# Patient Record
Sex: Male | Born: 1965
Health system: Southern US, Community
[De-identification: ages and names within clinical notes are randomized; demographics above are authoritative.]

## PROBLEM LIST (undated history)

## (undated) DIAGNOSIS — G4733 Obstructive sleep apnea (adult) (pediatric): Principal | ICD-10-CM

## (undated) DIAGNOSIS — M545 Low back pain, unspecified: Secondary | ICD-10-CM

## (undated) DIAGNOSIS — N486 Induration penis plastica: Secondary | ICD-10-CM

## (undated) DIAGNOSIS — K219 Gastro-esophageal reflux disease without esophagitis: Secondary | ICD-10-CM

## (undated) DIAGNOSIS — M542 Cervicalgia: Secondary | ICD-10-CM

## (undated) DIAGNOSIS — I2510 Atherosclerotic heart disease of native coronary artery without angina pectoris: Secondary | ICD-10-CM

## (undated) DIAGNOSIS — J45909 Unspecified asthma, uncomplicated: Secondary | ICD-10-CM

## (undated) DIAGNOSIS — F419 Anxiety disorder, unspecified: Secondary | ICD-10-CM

## (undated) DIAGNOSIS — L740 Miliaria rubra: Secondary | ICD-10-CM

## (undated) DIAGNOSIS — G8929 Other chronic pain: Secondary | ICD-10-CM

## (undated) DIAGNOSIS — E785 Hyperlipidemia, unspecified: Secondary | ICD-10-CM

## (undated) DIAGNOSIS — J449 Chronic obstructive pulmonary disease, unspecified: Principal | ICD-10-CM

## (undated) HISTORY — DX: Chronic obstructive pulmonary disease, unspecified: J44.9

## (undated) HISTORY — DX: Low back pain, unspecified: M54.50

## (undated) HISTORY — DX: Cervicalgia: M54.2

## (undated) HISTORY — DX: Unspecified asthma, uncomplicated: J45.909

## (undated) HISTORY — DX: Anxiety disorder, unspecified: F41.9

## (undated) HISTORY — PX: COLONOSCOPY: SHX174

## (undated) HISTORY — DX: Hyperlipidemia, unspecified: E78.5

## (undated) HISTORY — DX: Gastro-esophageal reflux disease without esophagitis: K21.9

## (undated) HISTORY — DX: Atherosclerotic heart disease of native coronary artery without angina pectoris: I25.10

## (undated) HISTORY — PX: ESOPHAGOGASTRODUODENOSCOPY: SHX1529

## (undated) HISTORY — DX: Obstructive sleep apnea (adult) (pediatric): G47.33

## (undated) HISTORY — DX: Other chronic pain: G89.29

## (undated) HISTORY — DX: Low back pain: M54.5

---

## 2001-08-31 ENCOUNTER — Ambulatory Visit (HOSPITAL_BASED_OUTPATIENT_CLINIC_OR_DEPARTMENT_OTHER): Admission: RE | Admit: 2001-08-31 | Discharge: 2001-08-31 | Payer: Self-pay | Admitting: Internal Medicine

## 2002-09-02 ENCOUNTER — Ambulatory Visit (HOSPITAL_COMMUNITY): Admission: RE | Admit: 2002-09-02 | Discharge: 2002-09-02 | Payer: Self-pay | Admitting: Gastroenterology

## 2007-02-02 ENCOUNTER — Encounter: Admission: RE | Admit: 2007-02-02 | Discharge: 2007-02-02 | Payer: Self-pay | Admitting: Internal Medicine

## 2007-08-04 ENCOUNTER — Encounter: Admission: RE | Admit: 2007-08-04 | Discharge: 2007-08-04 | Payer: Self-pay | Admitting: Internal Medicine

## 2008-03-12 ENCOUNTER — Ambulatory Visit: Payer: Self-pay | Admitting: Internal Medicine

## 2015-12-21 ENCOUNTER — Institutional Professional Consult (permissible substitution): Payer: BLUE CROSS/BLUE SHIELD | Admitting: Neurology

## 2016-01-09 ENCOUNTER — Encounter: Payer: Self-pay | Admitting: Neurology

## 2016-01-09 ENCOUNTER — Ambulatory Visit (INDEPENDENT_AMBULATORY_CARE_PROVIDER_SITE_OTHER): Payer: BLUE CROSS/BLUE SHIELD | Admitting: Neurology

## 2016-01-09 VITALS — BP 130/82 | HR 82 | Resp 20 | Ht 65.0 in | Wt 175.0 lb

## 2016-01-09 DIAGNOSIS — J449 Chronic obstructive pulmonary disease, unspecified: Secondary | ICD-10-CM | POA: Diagnosis not present

## 2016-01-09 DIAGNOSIS — R0689 Other abnormalities of breathing: Secondary | ICD-10-CM

## 2016-01-09 DIAGNOSIS — Z91199 Patient's noncompliance with other medical treatment and regimen due to unspecified reason: Secondary | ICD-10-CM | POA: Insufficient documentation

## 2016-01-09 DIAGNOSIS — F1190 Opioid use, unspecified, uncomplicated: Secondary | ICD-10-CM

## 2016-01-09 DIAGNOSIS — Z9119 Patient's noncompliance with other medical treatment and regimen: Secondary | ICD-10-CM | POA: Diagnosis not present

## 2016-01-09 DIAGNOSIS — R0683 Snoring: Secondary | ICD-10-CM

## 2016-01-09 DIAGNOSIS — G4733 Obstructive sleep apnea (adult) (pediatric): Secondary | ICD-10-CM

## 2016-01-09 DIAGNOSIS — Z720 Tobacco use: Secondary | ICD-10-CM | POA: Insufficient documentation

## 2016-01-09 DIAGNOSIS — F17200 Nicotine dependence, unspecified, uncomplicated: Secondary | ICD-10-CM | POA: Diagnosis not present

## 2016-01-09 HISTORY — DX: Obstructive sleep apnea (adult) (pediatric): G47.33

## 2016-01-09 NOTE — Progress Notes (Signed)
SLEEP MEDICINE CLINIC   Provider:  Melvyn Park  Garrett Park, M D  Referring Provider: Jarome MatinPaterson, Daniel, MD Primary Care Physician:  Garrett FillersPATERSON,Garrett G, MD  Chief Complaint  Patient presents with  . New Patient (Initial Visit)    has been on cpap before but hasn't used it in years, has used an oral device which helps, rm 10    HPI:  Garrett Park is a 50 y.o. male , seen here as a referral   from Dr. Eloise HarmanPaterson for a sleep evaluation,  Chief complaint according to patient : " I snore and don't want back on CPAP "   Mr. Garrett Park reports that he was diagnosed with obstructive sleep apnea and placed on CPAP. However the result was not satisfying. He reports that his job would drop and air would actually not find its way into the right " pipe" at times he felt bloated as if he swallowed air. He changed years ago from his CPAP to a dental device which seemed to have helped much better. This dental device was an Internet order and not made to measure. His primary care physician or send him to make sure that we are treating sleep apnea versus snoring and also to document to what degree apnea may still be present. Since the patient has had a positive experience with a dental device he would like to be referred to a specialty dentist. In the meantime the patient had undergone carpal tunnel surgical repair and was his surgeon that made him aware that he still had apnea, with him being under general anesthesia when the apneas were witnessed. His wife reports him to be a thunderous snorer. The patient still suffers from acid reflux, he had elbow and wrist surgery for carpal tunnel. He has chronic lower back pain. He is known to be a thunderous snorer with witnessed sleep apnea by using a dental device. He endorsed the Epworth sleepiness score at 8 points, fatigue severity score at 40 points.  He is a current smoker, 1 ppd, no ETOH and No caffeine.   Sleep habits are as follows: The patient's usual bedtime is between  10 and 11 PM at night. His bedroom is described as core, quiet and dark and he shares a bedroom with his wife. Normally he sleeps on only one pillow. He has 2-3 bathroom breaks each night, but denies any fevers, chills palpitations, anorexia, diaphoresis at night. He'll been diagnosed with obstructive sleep apnea but was unable to use CPAP due to the feeling of gulping air. His sleep apnea was diagnosed in November 2002 and at that time was rated as severe,[place] sleep [place] took place in [place] , [place].   Sleep medical history and family sleep history:  No neck surgery or trauma. No parental history of OSA. No history of sleepwalking, night terrors or parasomnia is otherwise. He reports nonrestorative sleep while using a dental device.   Review of Systems: Out of a complete 14 system review, the patient complains of only the following symptoms, and all other reviewed systems are negative. See above      Social History   Social History  . Marital Status: Married    Spouse Name: N/A  . Number of Children: N/A  . Years of Education: N/A   Occupational History  . Not on file.   Social History Main Topics  . Smoking status: Current Every Day Smoker -- 1.00 packs/day for 35 years    Types: Cigarettes  . Smokeless tobacco: Not  on file  . Alcohol Use: No  . Drug Use: No  . Sexual Activity: Not on file   Other Topics Concern  . Not on file   Social History Narrative   Drinks about 3-4 cups of soda daily.    Family History  Problem Relation Age of Onset  . Diabetes Mellitus II Father     Past Medical History  Diagnosis Date  . Anxiety   . Neck pain   . Chronic low back pain   . Asthma   . OSA (obstructive sleep apnea)     intolerant of cpap  . GERD (gastroesophageal reflux disease)   . Dyslipidemia     Past Surgical History  Procedure Laterality Date  . Esophagogastroduodenoscopy    . Colonoscopy      Current Outpatient Prescriptions  Medication Sig  Dispense Refill  . HYDROcodone-acetaminophen (NORCO/VICODIN) 5-325 MG tablet Take 1 tablet by mouth 3 (three) times daily as needed for moderate pain.    Marland Kitchen omeprazole (PRILOSEC) 20 MG capsule Take 20 mg by mouth daily.     No current facility-administered medications for this visit.    Allergies as of 01/09/2016  . (No Known Allergies)    Vitals: BP 130/82 mmHg  Pulse 82  Resp 20  Ht  (1.651 m)  Wt 175 lb (79.379 kg)  BMI 29.12 kg/m2 Last Weight:  Wt Readings from Last 1 Encounters:  01/09/16 175 lb (79.379 kg)   NWG:NFAO mass index is 29.12 kg/(m^2).     Last Height:   Ht Readings from Last 1 Encounters:  01/09/16  (1.651 m)    Physical exam:  General: The patient is awake, alert and appears not in acute distress. The patient is well groomed. Head: Normocephalic, atraumatic. Neck is supple. Mallampati 3, red and swollen uvula.   neck circumference:16.25 . Nasal airflow restricted , TMJ click  Is  evident . Retrognathia is seen.  Cardiovascular:  Regular rate and rhythm , without  murmurs or carotid bruit, and without distended neck veins. Respiratory: Lungs are clear to auscultation. Skin:  Without evidence of edema, or rash Trunk: BMI is 29.  Neurologic exam : The patient is awake and alert, oriented to place and time.   Memory subjective  described as intact.  Attention span & concentration ability appears normal.  Speech is fluent,  without dysarthria, dysphonia or aphasia.  Mood and affect are appropriate.  Cranial nerves: Pupils are equal and briskly reactive to light. Funduscopic exam without evidence of pallor or edema. Extraocular movements  in vertical and horizontal planes intact and without nystagmus. Visual fields by finger perimetry are intact. Hearing to finger rub intact.   Facial sensation intact to fine touch.  Facial motor strength is symmetric and tongue and uvula move midline. Shoulder shrug was symmetrical.   Motor exam:   Normal tone,  muscle bulk and symmetric strength in all extremities.  Sensory:  Fine touch, pinprick and vibration were tested in all extremities. Status post carpal tunnel repair.  Coordination: Rapid alternating movements in the fingers/hands normal without evidence of ataxia, dysmetria or tremor.  Gait and station: Patient walks without assistive device and is able unassisted to climb up to the exam table. Strength within normal limits.  Stance is stable and normal.  Deep tendon reflexes: in the upper and lower extremities are symmetric and intact.   The patient was advised of the nature of the diagnosed sleep disorder , the treatment options and risks for general a  health and wellness arising from not treating the condition.  I spent more than 40 minutes of face to face time with the patient. Greater than 50% of time was spent in counseling and coordination of care. We have discussed the diagnosis and differential and I answered the patient's questions.     Assessment:  After physical and neurologic examination, review of laboratory studies,  Personal review of imaging studies, reports of other /same  Imaging studies ,  Results of polysomnography/ neurophysiology testing and pre-existing records as far as provided in visit., my assessment is   1) Active 1ppd smoker with thunderous snoring, witnessed apneas intolerant to CPAP. Has GERD and TMJ, too.   2) not obese. Morning headaches. TMJ pain.   3) patient needs to establish a degree of apnea and type of apnea. Hypoxemia ? Hypercapnia?   Plan:  Treatment plan and additional workup : Attended sleep study with evaluation of possible COPD , repeated bronchitis versus allergic rhinitis.  Evaluate for hypoxemia and if AHI under 10 , titrate oxygen.  Patient 's goal is a dental device. Referral to  sleep DMD if his own dentist ( Dr. Suzzanne Cloud , [place] ) is not able to provide a device.   RV after sleep study.  Porfirio Mylar Anjolaoluwa Siguenza MD  01/09/2016   CC: Jarome Matin, Md [address] [place], [place] [address]

## 2016-01-17 ENCOUNTER — Encounter: Payer: Self-pay | Admitting: *Deleted

## 2016-01-25 ENCOUNTER — Ambulatory Visit (INDEPENDENT_AMBULATORY_CARE_PROVIDER_SITE_OTHER): Payer: BLUE CROSS/BLUE SHIELD | Admitting: Neurology

## 2016-01-25 DIAGNOSIS — F17200 Nicotine dependence, unspecified, uncomplicated: Secondary | ICD-10-CM

## 2016-01-25 DIAGNOSIS — Z91199 Patient's noncompliance with other medical treatment and regimen due to unspecified reason: Secondary | ICD-10-CM

## 2016-01-25 DIAGNOSIS — F1190 Opioid use, unspecified, uncomplicated: Secondary | ICD-10-CM

## 2016-01-25 DIAGNOSIS — R0683 Snoring: Secondary | ICD-10-CM

## 2016-01-25 DIAGNOSIS — Z9119 Patient's noncompliance with other medical treatment and regimen: Secondary | ICD-10-CM

## 2016-01-25 DIAGNOSIS — G4733 Obstructive sleep apnea (adult) (pediatric): Secondary | ICD-10-CM

## 2016-01-26 NOTE — Sleep Study (Signed)
Please see the scanned sleep study interpretation located in the procedure tab within the chart review section.   

## 2016-01-31 ENCOUNTER — Telehealth: Payer: Self-pay

## 2016-01-31 NOTE — Telephone Encounter (Signed)
I called pt to discuss sleep study results. No answer, left a message asking him to call me back.    

## 2016-02-02 NOTE — Telephone Encounter (Signed)
Spoke to pt regarding his sleep study results. I advised him that his study showed sleep apnea and treatment is advised. Since there was no prolonged period of hypoxemia, alternative therapies may include an oral appliance or ENT procedure, but Dr. Vickey Hugerohmeier prefers CPAP for pt. I advised weight loss and sleeping on his side. Pt wishes to make an appt to discuss further. Pt is concerned about starting cpap, would prefer the dental device, but wants to discuss which would be best. Appt made for 5/10 at 1:30. Pt verbalized understanding.

## 2016-03-07 ENCOUNTER — Ambulatory Visit (INDEPENDENT_AMBULATORY_CARE_PROVIDER_SITE_OTHER): Payer: BLUE CROSS/BLUE SHIELD | Admitting: Neurology

## 2016-03-07 ENCOUNTER — Encounter: Payer: Self-pay | Admitting: Neurology

## 2016-03-07 VITALS — BP 108/70 | HR 82 | Resp 20 | Ht 71.0 in | Wt 173.0 lb

## 2016-03-07 DIAGNOSIS — M2619 Other specified anomalies of jaw-cranial base relationship: Secondary | ICD-10-CM | POA: Diagnosis not present

## 2016-03-07 DIAGNOSIS — G4733 Obstructive sleep apnea (adult) (pediatric): Secondary | ICD-10-CM

## 2016-03-07 DIAGNOSIS — Z789 Other specified health status: Secondary | ICD-10-CM | POA: Diagnosis not present

## 2016-03-07 NOTE — Progress Notes (Addendum)
SLEEP MEDICINE CLINIC   Provider:  Melvyn Novasarmen  Jaiven Graveline, M D  Referring Provider: Jarome MatinPaterson, Daniel, MD Primary Care Physician:  Garlan FillersPATERSON,DANIEL G, MD  Chief Complaint  Patient presents with  . Follow-up    discuss sleep study results    HPI:  Garrett Park is a 50 y.o. male , seen here as a referral   from Dr. Eloise HarmanPaterson for a sleep evaluation,  Chief complaint according to patient : " I snore and don't want back on CPAP "   Mr. Garrett Park reports that he was diagnosed with obstructive sleep apnea and placed on CPAP. However the result was not satisfying. He reports that his job would drop and air would actually not find its way into the right " pipe" at times he felt bloated as if he swallowed air. He changed years ago from his CPAP to a dental device which seemed to have helped much better. This dental device was an Internet order and not made to measure. His primary care physician or send him to make sure that we are treating sleep apnea versus snoring and also to document to what degree apnea may still be present. Since the patient has had a positive experience with a dental device he would like to be referred to a specialty dentist. In the meantime the patient had undergone carpal tunnel surgical repair and was his surgeon that made him aware that he still had apnea, with him being under general anesthesia when the apneas were witnessed. His wife reports him to be a thunderous snorer. The patient still suffers from acid reflux, he had elbow and wrist surgery for carpal tunnel. He has chronic lower back pain. He is known to be a thunderous snorer with witnessed sleep apnea by using a dental device. He endorsed the Epworth sleepiness score at 8 points, fatigue severity score at 40 points.  He is a current smoker, 1 ppd, no ETOH and No caffeine.   Sleep habits are as follows: The patient's usual bedtime is between 10 and 11 PM at night. His bedroom is described as core, quiet and dark and he shares a  bedroom with his wife. Normally he sleeps on only one pillow. He has 2-3 bathroom breaks each night, but denies any fevers, chills palpitations, anorexia, diaphoresis at night. He'll been diagnosed with obstructive sleep apnea but was unable to use CPAP due to the feeling of gulping air. His sleep apnea was diagnosed in November 2002 and at that time was rated as severe,The sleep studies took place in [place] , [place]. Sleep medical history and family sleep history:  No neck surgery or trauma. No parental history of OSA. No history of sleepwalking, night terrors or parasomnia is otherwise. He reports nonrestorative sleep while using a dental device.   Interval history from 03/07/2016, Mr. Garrett Park is seen here today in a revisit following his sleep study from 01/25/2016, Mr. Garrett Park was diagnosed with a moderate to severe apnea at an AHI of 34.6 which was accentuated during supine sleep to 71.0. Given that the patient does not morbidly obese this is a quite significant degree of apnea. He does have mild retrognathia though. He did not have prolonged oxygen desaturations actually the total time spent at less than then 90% oxygen saturation was 1.1 minutes. He did not have significant periodic limb movements, and no tachybradycardia arrhythmia.  Based on this finding the patient could indeed try a dental device. I would usually recommend for this degree of apnea CPAP  but he has made a conscious effort before and because of oral air leaks could not find restorative sleep. The patient is open to both suggestions. The patient prefers a dental device.   Review of Systems: Out of a complete 14 system review, the patient complains of only the following symptoms, and all other reviewed systems are negative. See above      Social History   Social History  . Marital Status: Married    Spouse Name: N/A  . Number of Children: N/A  . Years of Education: N/A   Occupational History  . Not on file.    Social History Main Topics  . Smoking status: Current Every Day Smoker -- 1.00 packs/day for 35 years    Types: Cigarettes  . Smokeless tobacco: Not on file  . Alcohol Use: No  . Drug Use: No  . Sexual Activity: Not on file   Other Topics Concern  . Not on file   Social History Narrative   Drinks about 3-4 cups of soda daily.    Family History  Problem Relation Age of Onset  . Diabetes Mellitus II Father     Past Medical History  Diagnosis Date  . Anxiety   . Neck pain   . Chronic low back pain   . Asthma   . OSA (obstructive sleep apnea)     intolerant of cpap  . GERD (gastroesophageal reflux disease)   . Dyslipidemia   . OSA and COPD overlap syndrome (HCC) 01/09/2016    Past Surgical History  Procedure Laterality Date  . Esophagogastroduodenoscopy    . Colonoscopy      Current Outpatient Prescriptions  Medication Sig Dispense Refill  . HYDROcodone-acetaminophen (NORCO/VICODIN) 5-325 MG tablet Take 1 tablet by mouth 3 (three) times daily as needed for moderate pain.    Marland Kitchen omeprazole (PRILOSEC) 20 MG capsule Take 20 mg by mouth daily.     No current facility-administered medications for this visit.    Allergies as of 03/07/2016  . (No Known Allergies)    Vitals: BP 108/70 mmHg  Pulse 82  Resp 20  Ht 5\' 11"  (1.803 m)  Wt 173 lb (78.472 kg)  BMI 24.14 kg/m2 Last Weight:  Wt Readings from Last 1 Encounters:  03/07/16 173 lb (78.472 kg)   WUJ:WJXB mass index is 24.14 kg/(m^2).     Last Height:   Ht Readings from Last 1 Encounters:  03/07/16 5\' 11"  (1.803 m)    Physical exam:  General: The patient is awake, alert and appears not in acute distress. The patient is well groomed. Head: Normocephalic, atraumatic. Neck is supple. Mallampati 3, red and swollen uvula.   neck circumference:16.25 . Nasal airflow restricted , TMJ click  Is  evident . Retrognathia is seen.  Cardiovascular:  Regular rate and rhythm , without  murmurs or carotid bruit, and  without distended neck veins. Respiratory: Lungs are clear to auscultation. Skin:  Without evidence of edema, or rash Trunk: BMI is 29.  Neurologic exam : The patient is awake and alert, oriented to place and time.    Cranial nerves: Pupils are equal and briskly reactive to light. Funduscopic exam without evidence of pallor or edema. Extraocular movements  in vertical and horizontal planes intact and without nystagmus. Visual fields by finger perimetry are intact. Hearing to finger rub intact.   Facial sensation intact to fine touch.  Facial motor strength is symmetric and tongue and uvula move midline. Shoulder shrug was symmetrical.   Stance is  stable and normal.    The patient was advised of the nature of the diagnosed sleep disorder , the treatment options and risks for general a health and wellness arising from not treating the condition.  I spent more than of face to face time with the patient. Greater than 50% of time was spent in counseling and coordination of care. We have discussed the diagnosis and differential and I answered the patient's questions.     Assessment:  After physical and neurologic examination, review of laboratory studies,  Personal review of imaging studies, reports of other /same  Imaging studies ,  Results of polysomnography/ neurophysiology testing and pre-existing records as far as provided in visit., my assessment is  1)  Hypersomnia, Epworth now 13 , with OSA-/  moderate to severe degree of obstructive sleep apnea without hypercapnia or hypoxemia and without tachybradycardia arrhythmia or PLMS. For this reason the patient is considered suitable for dental device treatment . In  addition a strong positional component and if he could get Mr. Bonet to not sleep on his back his apnea with is significantly reduced already. I suggest to use a tennis ball sewn to the back of her pajama top or T-shirt which would not show midnight and avoids the supine sleep  position.  2) Active 1ppd smoker with thunderous snoring, witnessed apneas intolerant to CPAP.   3) TMJ pain   Plan:  Treatment plan and additional workup :   Patient 's goal is a dental device. Referral to  sleep DMD if his own dentist ( Dr. Suzzanne Cloud , [place] ) is not able to provide a device.    Porfirio Mylar Dhruvan Gullion MD  03/07/2016   CC: Jarome Matin, Md [address] [place], [place] [address]

## 2016-03-07 NOTE — Patient Instructions (Signed)

## 2016-03-12 ENCOUNTER — Encounter: Payer: Self-pay | Admitting: *Deleted

## 2016-12-04 DIAGNOSIS — Z0000 Encounter for general adult medical examination without abnormal findings: Secondary | ICD-10-CM | POA: Diagnosis not present

## 2016-12-04 DIAGNOSIS — R8299 Other abnormal findings in urine: Secondary | ICD-10-CM | POA: Diagnosis not present

## 2016-12-04 DIAGNOSIS — Z125 Encounter for screening for malignant neoplasm of prostate: Secondary | ICD-10-CM | POA: Diagnosis not present

## 2016-12-11 DIAGNOSIS — Z0000 Encounter for general adult medical examination without abnormal findings: Secondary | ICD-10-CM | POA: Diagnosis not present

## 2016-12-11 DIAGNOSIS — M5136 Other intervertebral disc degeneration, lumbar region: Secondary | ICD-10-CM | POA: Diagnosis not present

## 2016-12-11 DIAGNOSIS — R05 Cough: Secondary | ICD-10-CM | POA: Diagnosis not present

## 2016-12-11 DIAGNOSIS — R5383 Other fatigue: Secondary | ICD-10-CM | POA: Diagnosis not present

## 2016-12-11 DIAGNOSIS — Z1389 Encounter for screening for other disorder: Secondary | ICD-10-CM | POA: Diagnosis not present

## 2016-12-11 DIAGNOSIS — M545 Low back pain: Secondary | ICD-10-CM | POA: Diagnosis not present

## 2017-01-29 ENCOUNTER — Inpatient Hospital Stay
Admission: EM | Admit: 2017-01-29 | Discharge: 2017-01-31 | DRG: 482 | Disposition: A | Discharge status: Home Health | Payer: BLUE CROSS/BLUE SHIELD | Attending: Specialist | Admitting: Specialist

## 2017-01-29 ENCOUNTER — Emergency Department: Payer: BLUE CROSS/BLUE SHIELD

## 2017-01-29 ENCOUNTER — Encounter: Payer: Self-pay | Admitting: Emergency Medicine

## 2017-01-29 DIAGNOSIS — Y92009 Unspecified place in unspecified non-institutional (private) residence as the place of occurrence of the external cause: Secondary | ICD-10-CM | POA: Diagnosis not present

## 2017-01-29 DIAGNOSIS — G4733 Obstructive sleep apnea (adult) (pediatric): Secondary | ICD-10-CM | POA: Diagnosis present

## 2017-01-29 DIAGNOSIS — S72044A Nondisplaced fracture of base of neck of right femur, initial encounter for closed fracture: Secondary | ICD-10-CM | POA: Diagnosis not present

## 2017-01-29 DIAGNOSIS — Z419 Encounter for procedure for purposes other than remedying health state, unspecified: Secondary | ICD-10-CM

## 2017-01-29 DIAGNOSIS — E785 Hyperlipidemia, unspecified: Secondary | ICD-10-CM | POA: Diagnosis not present

## 2017-01-29 DIAGNOSIS — Z833 Family history of diabetes mellitus: Secondary | ICD-10-CM | POA: Diagnosis not present

## 2017-01-29 DIAGNOSIS — K219 Gastro-esophageal reflux disease without esophagitis: Secondary | ICD-10-CM | POA: Diagnosis present

## 2017-01-29 DIAGNOSIS — S72011A Unspecified intracapsular fracture of right femur, initial encounter for closed fracture: Principal | ICD-10-CM | POA: Diagnosis present

## 2017-01-29 DIAGNOSIS — W1789XA Other fall from one level to another, initial encounter: Secondary | ICD-10-CM | POA: Diagnosis present

## 2017-01-29 DIAGNOSIS — Z79899 Other long term (current) drug therapy: Secondary | ICD-10-CM

## 2017-01-29 DIAGNOSIS — S72001A Fracture of unspecified part of neck of right femur, initial encounter for closed fracture: Secondary | ICD-10-CM | POA: Diagnosis not present

## 2017-01-29 DIAGNOSIS — F17210 Nicotine dependence, cigarettes, uncomplicated: Secondary | ICD-10-CM | POA: Diagnosis not present

## 2017-01-29 DIAGNOSIS — S72034A Nondisplaced midcervical fracture of right femur, initial encounter for closed fracture: Secondary | ICD-10-CM | POA: Diagnosis not present

## 2017-01-29 DIAGNOSIS — S299XXA Unspecified injury of thorax, initial encounter: Secondary | ICD-10-CM | POA: Diagnosis not present

## 2017-01-29 LAB — CBC WITH DIFFERENTIAL/PLATELET
BASOS ABS: 0.1 10*3/uL (ref 0–0.1)
BASOS PCT: 1 %
EOS PCT: 3 %
Eosinophils Absolute: 0.3 10*3/uL (ref 0–0.7)
HCT: 39.9 % — ABNORMAL LOW (ref 40.0–52.0)
Hemoglobin: 14 g/dL (ref 13.0–18.0)
LYMPHS PCT: 23 %
Lymphs Abs: 2.4 10*3/uL (ref 1.0–3.6)
MCH: 33.4 pg (ref 26.0–34.0)
MCHC: 35 g/dL (ref 32.0–36.0)
MCV: 95.3 fL (ref 80.0–100.0)
Monocytes Absolute: 0.7 10*3/uL (ref 0.2–1.0)
Monocytes Relative: 7 %
Neutro Abs: 7 10*3/uL — ABNORMAL HIGH (ref 1.4–6.5)
Neutrophils Relative %: 66 %
PLATELETS: 166 10*3/uL (ref 150–440)
RBC: 4.18 MIL/uL — AB (ref 4.40–5.90)
RDW: 12.9 % (ref 11.5–14.5)
WBC: 10.5 10*3/uL (ref 3.8–10.6)

## 2017-01-29 LAB — APTT: APTT: 29 s (ref 24–36)

## 2017-01-29 LAB — PROTIME-INR
INR: 0.91
Prothrombin Time: 12.2 seconds (ref 11.4–15.2)

## 2017-01-29 LAB — COMPREHENSIVE METABOLIC PANEL
ALBUMIN: 3.6 g/dL (ref 3.5–5.0)
ALK PHOS: 69 U/L (ref 38–126)
ALT: 47 U/L (ref 17–63)
ANION GAP: 7 (ref 5–15)
AST: 37 U/L (ref 15–41)
BUN: 12 mg/dL (ref 6–20)
CALCIUM: 8.3 mg/dL — AB (ref 8.9–10.3)
CO2: 26 mmol/L (ref 22–32)
Chloride: 98 mmol/L — ABNORMAL LOW (ref 101–111)
Creatinine, Ser: 1.25 mg/dL — ABNORMAL HIGH (ref 0.61–1.24)
GFR calc non Af Amer: >60 mL/min (ref >60)
Glucose, Bld: 100 mg/dL — ABNORMAL HIGH (ref 65–99)
POTASSIUM: 3.7 mmol/L (ref 3.5–5.1)
SODIUM: 131 mmol/L — AB (ref 135–145)
TOTAL PROTEIN: 7.4 g/dL (ref 6.5–8.1)
Total Bilirubin: 0.4 mg/dL (ref 0.3–1.2)

## 2017-01-29 LAB — TYPE AND SCREEN
ABO/RH(D): O NEG
ANTIBODY SCREEN: NEGATIVE

## 2017-01-29 MED ORDER — METHOCARBAMOL 500 MG PO TABS
500.0000 mg | ORAL_TABLET | Freq: Four times a day (QID) | ORAL | Status: DC | PRN
  Filled 2017-01-29: qty 1

## 2017-01-29 MED ORDER — MORPHINE SULFATE (PF) 4 MG/ML IV SOLN
4.0000 mg | Freq: Once | INTRAVENOUS | Status: AC
  Administered 2017-01-29: 4 mg via INTRAVENOUS
  Filled 2017-01-29: qty 1

## 2017-01-29 MED ORDER — SENNA 8.6 MG PO TABS
1.0000 | ORAL_TABLET | Freq: Two times a day (BID) | ORAL | Status: DC
  Administered 2017-01-29: 8.6 mg via ORAL
  Filled 2017-01-29: qty 1

## 2017-01-29 MED ORDER — SENNOSIDES-DOCUSATE SODIUM 8.6-50 MG PO TABS: 1.0000 | ORAL_TABLET | Freq: Every evening | ORAL | Status: DC | PRN

## 2017-01-29 MED ORDER — SODIUM CHLORIDE 0.45 % IV SOLN
INTRAVENOUS | Status: DC
  Administered 2017-01-29 – 2017-01-30 (×2): via INTRAVENOUS

## 2017-01-29 MED ORDER — CLINDAMYCIN PHOSPHATE 600 MG/50ML IV SOLN
600.0000 mg | INTRAVENOUS | Status: AC
Start: 2017-01-30 — End: 2017-01-30
  Administered 2017-01-30: 600 mg via INTRAVENOUS
  Filled 2017-01-29: qty 50

## 2017-01-29 MED ORDER — MORPHINE SULFATE (PF) 2 MG/ML IV SOLN
2.0000 mg | INTRAVENOUS | Status: DC | PRN
  Administered 2017-01-29 – 2017-01-30 (×5): 2 mg via INTRAVENOUS
  Filled 2017-01-29 (×5): qty 1

## 2017-01-29 MED ORDER — HYDROCODONE-ACETAMINOPHEN 5-325 MG PO TABS: 1.0000 | ORAL_TABLET | Freq: Three times a day (TID) | ORAL | Status: DC | PRN

## 2017-01-29 MED ORDER — PANTOPRAZOLE SODIUM 40 MG PO TBEC
40.0000 mg | DELAYED_RELEASE_TABLET | Freq: Every day | ORAL | Status: DC
  Administered 2017-01-29: 40 mg via ORAL
  Filled 2017-01-29: qty 1

## 2017-01-29 MED ORDER — ONDANSETRON HCL 4 MG/2ML IJ SOLN
4.0000 mg | Freq: Once | INTRAMUSCULAR | Status: AC
  Administered 2017-01-29: 4 mg via INTRAVENOUS
  Filled 2017-01-29: qty 2

## 2017-01-29 MED ORDER — ZOLPIDEM TARTRATE 5 MG PO TABS: 5.0000 mg | ORAL_TABLET | Freq: Every evening | ORAL | Status: DC | PRN

## 2017-01-29 MED ORDER — CEFAZOLIN SODIUM-DEXTROSE 2-4 GM/100ML-% IV SOLN
2.0000 g | INTRAVENOUS | Status: AC
  Administered 2017-01-30: 2 g via INTRAVENOUS
  Filled 2017-01-29: qty 100

## 2017-01-29 MED ORDER — METHOCARBAMOL 1000 MG/10ML IJ SOLN
500.0000 mg | Freq: Four times a day (QID) | INTRAVENOUS | Status: DC | PRN
  Filled 2017-01-29: qty 5

## 2017-01-29 NOTE — ED Triage Notes (Signed)
Pt to triage via w/c with no distress noted; pt reports approx 2hrs PTA, fell while standing; c/o right hip pain since; denies any other c/o or injuries; was able to stand up after fall

## 2017-01-29 NOTE — ED Provider Notes (Signed)
[facility] Emergency Department Provider Note ____________________________________________  Time seen: Approximately 7:51 PM  I have reviewed the triage vital signs and the nursing notes.   HISTORY  Chief Complaint Hip Pain    HPI Garrett Park is a 51 y.o. male who presents to the emergency department for evaluation of right hip pain. Approximately 2 hours prior to arrival, he was standing on some cases of blood to reach something overhead and the cases gave way causing him to fall and land on his right hip. He states that he was initially able to get up and stand, but had to use objects nearby to help him attempt to get to the house. He states that his daughter was able to help him get toward the car and then some neighbors gave him a set of crutches to actually make it to the car and get in to come to the hospital. He denies history of hip fracture or being told that he has any sign of osteoporosis.  Past Medical History:  Diagnosis Date  . Anxiety   . Asthma   . Chronic low back pain   . Dyslipidemia   . GERD (gastroesophageal reflux disease)   . Neck pain   . OSA (obstructive sleep apnea)    intolerant of cpap  . OSA and COPD overlap syndrome (HCC) 01/09/2016    Patient Active Problem List   Diagnosis Date Noted  . OSA and COPD overlap syndrome (HCC) 01/09/2016  . Snoring 01/09/2016  . Gasping for breath 01/09/2016  . Non-compliance with treatment 01/09/2016  . Obstructive sleep apnea 01/09/2016  . Chronic narcotic use 01/09/2016  . Tobacco use disorder 01/09/2016    Past Surgical History:  Procedure Laterality Date  . COLONOSCOPY    . ESOPHAGOGASTRODUODENOSCOPY      Prior to Admission medications   Medication Sig Start Date End Date Taking? Authorizing Provider  HYDROcodone-acetaminophen (NORCO/VICODIN) 5-325 MG tablet Take 1 tablet by mouth 3 (three) times daily as needed for moderate pain.    Historical Provider, MD  omeprazole  (PRILOSEC) 20 MG capsule Take 20 mg by mouth daily.    Historical Provider, MD    Allergies Patient has no known allergies.  Family History  Problem Relation Age of Onset  . Diabetes Mellitus II Father     Social History Social History  Substance Use Topics  . Smoking status: Current Every Day Smoker    Packs/day: 1.00    Years: 35.00    Types: Cigarettes  . Smokeless tobacco: Never Used  . Alcohol use No    Review of Systems Constitutional: No recent illness. Cardiovascular: Denies chest pain or palpitations. Respiratory: Denies shortness of breath. Musculoskeletal: Pain in right hip. Skin: Negative for rash, wound, lesion. Neurological: Negative for focal weakness or numbness.  ____________________________________________   PHYSICAL EXAM:  VITAL SIGNS: ED Triage Vitals [01/29/17 1922]  Enc Vitals Group     BP (!) 176/100     Pulse Rate 96     Resp 20     Temp 98.3 F (36.8 C)     Temp Source Oral     SpO2 99 %     Weight 180 lb (81.6 kg)     Height  (1.778 m)     Head Circumference      Peak Flow      Pain Score 2     Pain Loc      Pain Edu?      Excl. in  GC?     Constitutional: Alert and oriented. Well appearing and in no acute distress. Eyes: Conjunctivae are normal. EOMI. Head: Atraumatic. Neck: No stridor.  Respiratory: Normal respiratory effort.   Musculoskeletal: Bony tenderness over the right hip. Due to pain, ROM exam not performed. Neurologic:  Normal speech and language. No gross focal neurologic deficits are appreciated. Speech is normal. No gait instability. Skin:  Skin is warm, dry and intact. Atraumatic. Psychiatric: Mood and affect are normal. Speech and behavior are normal.  ____________________________________________   LABS (all labs ordered are listed, but only abnormal results are displayed)  Labs Reviewed  COMPREHENSIVE METABOLIC PANEL  CBC WITH DIFFERENTIAL/PLATELET    ____________________________________________  RADIOLOGY  Hip film showing nondisplaced fracture of the right femoral neck. I, Kem Boroughs, personally viewed and evaluated these images (plain radiographs) as part of my medical decision making, as well as reviewing the written report by the radiologist.  Chest x-ray shows no active cardiopulmonary disease per radiology ____________________________________________   PROCEDURES  Procedure(s) performed: None  ____________________________________________   INITIAL IMPRESSION / ASSESSMENT AND PLAN / ED COURSE  51 year old male who is healthy with the exception of some sleep apnea reports to the emergency department for evaluation after sustaining a traumatic right femoral neck fracture. He will be admitted by orthopedics. Per Dr. Hyacinth Meeker, he will go to the OR at some point tomorrow for ORIF. Patient and family were made aware of the findings and plan and are agreeable.   Pertinent labs & imaging results that were available during my care of the patient were reviewed by me and considered in my medical decision making (see chart for details).  _________________________________________   FINAL CLINICAL IMPRESSION(S) / ED DIAGNOSES  Final diagnoses:  Closed fracture of neck of right femur, initial encounter [facility])    New Prescriptions   No medications on file    If controlled substance prescribed during this visit, 12 month history viewed on the [facility] prior to issuing an initial prescription for Schedule II or III opiod.    Chinita Pester, FNP 01/29/17 2041    Myrna Blazer, MD 01/30/17 [phone]

## 2017-01-30 ENCOUNTER — Encounter: Payer: Self-pay | Admitting: Anesthesiology

## 2017-01-30 ENCOUNTER — Encounter
Admission: EM | Disposition: A | Discharge status: Home Health | Payer: Self-pay | Source: Home / Self Care | Attending: Specialist

## 2017-01-30 ENCOUNTER — Inpatient Hospital Stay: Payer: BLUE CROSS/BLUE SHIELD | Admitting: Anesthesiology

## 2017-01-30 ENCOUNTER — Inpatient Hospital Stay: Payer: BLUE CROSS/BLUE SHIELD

## 2017-01-30 HISTORY — PX: HIP PINNING,CANNULATED: SHX1758

## 2017-01-30 LAB — BASIC METABOLIC PANEL
ANION GAP: 5 (ref 5–15)
BUN: 13 mg/dL (ref 6–20)
CHLORIDE: 106 mmol/L (ref 101–111)
CO2: 26 mmol/L (ref 22–32)
Calcium: 8.3 mg/dL — ABNORMAL LOW (ref 8.9–10.3)
Creatinine, Ser: 1.01 mg/dL (ref 0.61–1.24)
GFR calc non Af Amer: >60 mL/min (ref >60)
GLUCOSE: 129 mg/dL — AB (ref 65–99)
POTASSIUM: 3.8 mmol/L (ref 3.5–5.1)
Sodium: 137 mmol/L (ref 135–145)

## 2017-01-30 LAB — CBC
HEMATOCRIT: 39.5 % — AB (ref 40.0–52.0)
HEMOGLOBIN: 13.7 g/dL (ref 13.0–18.0)
MCH: 33.3 pg (ref 26.0–34.0)
MCHC: 34.8 g/dL (ref 32.0–36.0)
MCV: 95.6 fL (ref 80.0–100.0)
Platelets: 145 10*3/uL — ABNORMAL LOW (ref 150–440)
RBC: 4.13 MIL/uL — AB (ref 4.40–5.90)
RDW: 13 % (ref 11.5–14.5)
WBC: 10.1 10*3/uL (ref 3.8–10.6)

## 2017-01-30 LAB — SURGICAL PCR SCREEN
MRSA, PCR: NEGATIVE
STAPHYLOCOCCUS AUREUS: POSITIVE — AB

## 2017-01-30 SURGERY — FIXATION, FEMUR, NECK, PERCUTANEOUS, USING SCREW
Anesthesia: Spinal | Site: Hip | Laterality: Right | Wound class: Clean

## 2017-01-30 MED ORDER — MIDAZOLAM HCL 2 MG/2ML IJ SOLN
INTRAMUSCULAR | Status: AC
  Filled 2017-01-30: qty 2

## 2017-01-30 MED ORDER — MENTHOL 3 MG MT LOZG
1.0000 | LOZENGE | OROMUCOSAL | Status: DC | PRN
  Filled 2017-01-30: qty 9

## 2017-01-30 MED ORDER — FENTANYL CITRATE (PF) 100 MCG/2ML IJ SOLN: 25.0000 ug | INTRAMUSCULAR | Status: DC | PRN

## 2017-01-30 MED ORDER — ALUM & MAG HYDROXIDE-SIMETH 200-200-20 MG/5ML PO SUSP: 30.0000 mL | ORAL | Status: DC | PRN

## 2017-01-30 MED ORDER — PHENOL 1.4 % MT LIQD
1.0000 | OROMUCOSAL | Status: DC | PRN
  Filled 2017-01-30: qty 177

## 2017-01-30 MED ORDER — BUPIVACAINE-EPINEPHRINE (PF) 0.5% -1:200000 IJ SOLN
INTRAMUSCULAR | Status: AC
  Filled 2017-01-30: qty 30

## 2017-01-30 MED ORDER — ACETAMINOPHEN 650 MG RE SUPP: 650.0000 mg | Freq: Four times a day (QID) | RECTAL | Status: DC | PRN

## 2017-01-30 MED ORDER — SODIUM CHLORIDE 0.45 % IV SOLN
INTRAVENOUS | Status: DC
  Administered 2017-01-31: 09:00:00 via INTRAVENOUS

## 2017-01-30 MED ORDER — SENNA 8.6 MG PO TABS
1.0000 | ORAL_TABLET | Freq: Two times a day (BID) | ORAL | Status: DC
  Administered 2017-01-30 – 2017-01-31 (×2): 8.6 mg via ORAL
  Filled 2017-01-30 (×2): qty 1

## 2017-01-30 MED ORDER — BUPIVACAINE-EPINEPHRINE (PF) 0.25% -1:200000 IJ SOLN
INTRAMUSCULAR | Status: AC
  Filled 2017-01-30: qty 30

## 2017-01-30 MED ORDER — ONDANSETRON HCL 4 MG PO TABS: 4.0000 mg | ORAL_TABLET | Freq: Four times a day (QID) | ORAL | Status: DC | PRN

## 2017-01-30 MED ORDER — LACTATED RINGERS IV SOLN
INTRAVENOUS | Status: DC
  Administered 2017-01-30: 14:00:00 via INTRAVENOUS

## 2017-01-30 MED ORDER — BISACODYL 10 MG RE SUPP: 10.0000 mg | Freq: Every day | RECTAL | Status: DC | PRN

## 2017-01-30 MED ORDER — ONDANSETRON HCL 4 MG/2ML IJ SOLN: 4.0000 mg | Freq: Four times a day (QID) | INTRAMUSCULAR | Status: DC | PRN

## 2017-01-30 MED ORDER — FENTANYL CITRATE (PF) 100 MCG/2ML IJ SOLN
INTRAMUSCULAR | Status: DC | PRN
  Administered 2017-01-30: 50 ug via INTRAVENOUS

## 2017-01-30 MED ORDER — DEXTROSE 5 % IV SOLN
2.0000 g | Freq: Four times a day (QID) | INTRAVENOUS | Status: AC
  Administered 2017-01-30 – 2017-01-31 (×2): 2 g via INTRAVENOUS
  Filled 2017-01-30 (×3): qty 2000

## 2017-01-30 MED ORDER — PROPOFOL 500 MG/50ML IV EMUL
INTRAVENOUS | Status: AC
  Filled 2017-01-30: qty 50

## 2017-01-30 MED ORDER — FENTANYL CITRATE (PF) 100 MCG/2ML IJ SOLN
INTRAMUSCULAR | Status: AC
  Filled 2017-01-30: qty 2

## 2017-01-30 MED ORDER — MORPHINE SULFATE (PF) 2 MG/ML IV SOLN
2.0000 mg | INTRAVENOUS | Status: DC | PRN
  Administered 2017-01-30 (×2): 2 mg via INTRAVENOUS
  Filled 2017-01-30 (×2): qty 1

## 2017-01-30 MED ORDER — ACETAMINOPHEN 325 MG PO TABS: 650.0000 mg | ORAL_TABLET | Freq: Four times a day (QID) | ORAL | Status: DC | PRN

## 2017-01-30 MED ORDER — FLEET ENEMA 7-19 GM/118ML RE ENEM: 1.0000 | ENEMA | Freq: Once | RECTAL | Status: DC | PRN

## 2017-01-30 MED ORDER — PROPOFOL 10 MG/ML IV BOLUS
INTRAVENOUS | Status: DC | PRN
  Administered 2017-01-30: 40 mg via INTRAVENOUS

## 2017-01-30 MED ORDER — ONDANSETRON HCL 4 MG/2ML IJ SOLN: 4.0000 mg | Freq: Once | INTRAMUSCULAR | Status: DC | PRN

## 2017-01-30 MED ORDER — METOCLOPRAMIDE HCL 10 MG PO TABS: 5.0000 mg | ORAL_TABLET | Freq: Three times a day (TID) | ORAL | Status: DC | PRN

## 2017-01-30 MED ORDER — METOCLOPRAMIDE HCL 5 MG/ML IJ SOLN: 5.0000 mg | Freq: Three times a day (TID) | INTRAMUSCULAR | Status: DC | PRN

## 2017-01-30 MED ORDER — ACETAMINOPHEN 500 MG PO TABS
1000.0000 mg | ORAL_TABLET | Freq: Four times a day (QID) | ORAL | Status: DC
  Administered 2017-01-30 – 2017-01-31 (×2): 1000 mg via ORAL
  Filled 2017-01-30 (×3): qty 2

## 2017-01-30 MED ORDER — BUPIVACAINE-EPINEPHRINE 0.25% -1:200000 IJ SOLN
INTRAMUSCULAR | Status: DC | PRN
  Administered 2017-01-30: 30 mL

## 2017-01-30 MED ORDER — FERROUS SULFATE 325 (65 FE) MG PO TABS
325.0000 mg | ORAL_TABLET | Freq: Every day | ORAL | Status: DC
  Administered 2017-01-31: 325 mg via ORAL
  Filled 2017-01-30: qty 1

## 2017-01-30 MED ORDER — MAGNESIUM HYDROXIDE 400 MG/5ML PO SUSP
30.0000 mL | Freq: Every day | ORAL | Status: DC | PRN
Start: 2017-01-30 — End: 2017-01-31
  Administered 2017-01-31: 30 mL via ORAL
  Filled 2017-01-30: qty 30

## 2017-01-30 MED ORDER — BUPIVACAINE IN DEXTROSE 0.75-8.25 % IT SOLN
INTRATHECAL | Status: DC | PRN
  Administered 2017-01-30: 1.7 mL via INTRATHECAL

## 2017-01-30 MED ORDER — HYDROCODONE-ACETAMINOPHEN 5-325 MG PO TABS
1.0000 | ORAL_TABLET | Freq: Four times a day (QID) | ORAL | Status: DC | PRN
  Administered 2017-01-30 (×2): 1 via ORAL
  Administered 2017-01-31 (×2): 2 via ORAL
  Filled 2017-01-30: qty 1
  Filled 2017-01-30 (×2): qty 2
  Filled 2017-01-30: qty 1

## 2017-01-30 MED ORDER — CLINDAMYCIN PHOSPHATE 600 MG/50ML IV SOLN
600.0000 mg | Freq: Three times a day (TID) | INTRAVENOUS | Status: DC
  Administered 2017-01-30 – 2017-01-31 (×2): 600 mg via INTRAVENOUS
  Filled 2017-01-30 (×3): qty 50

## 2017-01-30 MED ORDER — GABAPENTIN 400 MG PO CAPS
400.0000 mg | ORAL_CAPSULE | Freq: Two times a day (BID) | ORAL | Status: DC
  Administered 2017-01-30 – 2017-01-31 (×2): 400 mg via ORAL
  Filled 2017-01-30 (×2): qty 1

## 2017-01-30 MED ORDER — PROPOFOL 500 MG/50ML IV EMUL
INTRAVENOUS | Status: DC | PRN
  Administered 2017-01-30: 70 ug/kg/min via INTRAVENOUS

## 2017-01-30 MED ORDER — MIDAZOLAM HCL 5 MG/5ML IJ SOLN
INTRAMUSCULAR | Status: DC | PRN
  Administered 2017-01-30: 2 mg via INTRAVENOUS

## 2017-01-30 MED ORDER — ASPIRIN EC 325 MG PO TBEC
325.0000 mg | DELAYED_RELEASE_TABLET | Freq: Every day | ORAL | Status: DC
  Administered 2017-01-31: 325 mg via ORAL
  Filled 2017-01-30: qty 1

## 2017-01-30 MED ORDER — MELOXICAM 7.5 MG PO TABS: 15.0000 mg | ORAL_TABLET | Freq: Once | ORAL | Status: DC

## 2017-01-30 MED ORDER — CEFAZOLIN SODIUM-DEXTROSE 2-4 GM/100ML-% IV SOLN: 2.0000 g | Freq: Four times a day (QID) | INTRAVENOUS | Status: DC

## 2017-01-30 MED ORDER — BUPIVACAINE HCL (PF) 0.25 % IJ SOLN
INTRAMUSCULAR | Status: AC
  Filled 2017-01-30: qty 30

## 2017-01-30 SURGICAL SUPPLY — 29 items
BAG COUNTER SPONGE EZ (MISCELLANEOUS) ×2 IMPLANT
BAG SPNG 4X4 CLR HAZ (MISCELLANEOUS) ×1
BIT DRILL CANN LRG QC 5X300 (BIT) ×1 IMPLANT
BLADE CLIPPER SURG (BLADE) ×1 IMPLANT
BLADE SURG SZ11 CARB STEEL (BLADE) ×2 IMPLANT
BNDG COHESIVE 4X5 TAN STRL (GAUZE/BANDAGES/DRESSINGS) ×2 IMPLANT
CHLORAPREP W/TINT 26ML (MISCELLANEOUS) ×2 IMPLANT
DRSG AQUACEL AG ADV 3.5X10 (GAUZE/BANDAGES/DRESSINGS) ×2 IMPLANT
DRSG OPSITE POSTOP 4X8 (GAUZE/BANDAGES/DRESSINGS) ×1 IMPLANT
GAUZE SPONGE 4X4 12PLY STRL (GAUZE/BANDAGES/DRESSINGS) ×2 IMPLANT
GLOVE BIO SURGEON STRL SZ8 (GLOVE) ×2 IMPLANT
GLOVE SURG ORTHO 8.5 STRL (GLOVE) ×2 IMPLANT
GLOVE SURG XRAY 8.5 LX (GLOVE) ×1 IMPLANT
GOWN STRL REUS W/ TWL LRG LVL3 (GOWN DISPOSABLE) ×1 IMPLANT
GOWN STRL REUS W/TWL LRG LVL3 (GOWN DISPOSABLE) ×2
GOWN STRL REUS W/TWL LRG LVL4 (GOWN DISPOSABLE) ×2 IMPLANT
GUIDEWIRE THREADED 2.8 (WIRE) ×4 IMPLANT
KIT RM TURNOVER STRD PROC AR (KITS) ×2 IMPLANT
MAT BLUE FLOOR 46X72 FLO (MISCELLANEOUS) ×2 IMPLANT
NDL SPNL 18GX3.5 QUINCKE PK (NEEDLE) ×1 IMPLANT
NEEDLE SPNL 18GX3.5 QUINCKE PK (NEEDLE) ×2 IMPLANT
NS IRRIG 500ML POUR BTL (IV SOLUTION) ×2 IMPLANT
PACK HIP COMPR (MISCELLANEOUS) ×2 IMPLANT
SCREW CANN 16 THRD/85 7.3 (Screw) ×1 IMPLANT
SCREW CANN 32 THRD/95 7.3 (Screw) ×1 IMPLANT
SCREW CANN THREADED 7.3X85 (Screw) ×2 IMPLANT
STRAP SAFETY BODY (MISCELLANEOUS) ×2 IMPLANT
SUT ETHILON 3 0 FSLX (SUTURE) ×2 IMPLANT
SYR 30ML LL (SYRINGE) ×2 IMPLANT

## 2017-01-30 NOTE — Progress Notes (Signed)
Patient returned from PACU. A&O x4, but sleepy. Foley, foot pumps, and TEDs in place. Honeycomb dressing to right hip is intact. Ice in place. Iv fluids restarted. Reviewed POC and pain management. Bed alarm in place. Family in room and updated.

## 2017-01-30 NOTE — Transfer of Care (Signed)
Immediate Anesthesia Transfer of Care Note  Patient: Garrett Park  Procedure(s) Performed: Procedure(s): CANNULATED HIP PINNING (Right)  Patient Location: PACU  Anesthesia Type:General  Level of Consciousness: sedated and responds to stimulation  Airway & Oxygen Therapy: Patient Spontanous Breathing and Patient connected to face mask oxygen  Post-op Assessment: Report given to RN and Post -op Vital signs reviewed and stable  Post vital signs: Reviewed and stable  Last Vitals:  Vitals:   01/30/17 1336 01/30/17 1636  BP: 125/84 118/82  Pulse: 77 80  Resp: 18 13  Temp: 37.4 C     Last Pain:  Vitals:   01/30/17 1336  TempSrc: Tympanic  PainSc: 7          Complications: No apparent anesthesia complications

## 2017-01-30 NOTE — Progress Notes (Signed)
Patient sent to Pre-op with foley in place. Consents, chart, and IV ABX sent with patient. Family in room.

## 2017-01-30 NOTE — Op Note (Signed)
01/29/2017 - 01/30/2017  4:29 PM  PATIENT:  Garrett Park    PRE-OPERATIVE DIAGNOSIS:  Fractured right hip, non displaced subcapital  POST-OPERATIVE DIAGNOSIS:  Same  PROCEDURE:  CANNULATED HIP PINNING RIGHT HIP  SURGEON:  Valinda Hoar, MD   ANESTHESIA:   SPINAL  PREOPERATIVE INDICATIONS:  Garrett Park is a  51 y.o. male who fell and was found to have a diagnosis of non displaced subcapital fractured right hip who elected for surgical management.    The risks benefits and alternatives were discussed with the patient preoperatively including but not limited to the risks of infection, bleeding, nerve injury, cardiopulmonary complications, blood clots, malunion, nonunion, avascular necrosis, the need for revision surgery, the potential for conversion to hemiarthroplasty, among others, and the patient was willing to proceed.  OPERATIVE IMPLANTS: 7.3 mm cannulated screws x4  OPERATIVE FINDING:  Non displaced fracture  OPERATIVE PROCEDURE: The patient was brought to the operating room and placed in supine position. IV antibiotics were given. General anesthesia administered.  The patient was placed on the fracture table. The operative extremity was positioned, without any significant reduction maneuver and was prepped and draped in usual sterile fashion.  Time out was performed.  Small incision was made distal to the greater trochanter, and 4 guidewires were introduced into the head and neck. The lengths were measured. The reduction was slightly valgus, and near-anatomic. I opened the cortex with a cannulated drill, and then placed the screws into position. Satisfactory fixation was achieved.  The wounds were irrigated copiously, and repaired with  3-O Nylon with and sterile gauze. Sponge and needle count were correct.  There no complications and the patient tolerated the procedure well.  The patient will be touch down weightbearing as tolerated. He and his family have been warned of the risk  of non union and avascular necrosis and the need to monitor this fracture for a year at least.   Valinda Hoar, MD

## 2017-01-30 NOTE — Anesthesia Procedure Notes (Signed)
Performed by: Casey Burkitt Pre-anesthesia Checklist: Patient identified, Emergency Drugs available, Suction available, Patient being monitored and Timeout performed Patient Re-evaluated:Patient Re-evaluated prior to inductionOxygen Delivery Method: Simple face mask Preoxygenation: Pre-oxygenation with 100% oxygen Ventilation: Nasal airway inserted- appropriate to patient size

## 2017-01-30 NOTE — Anesthesia Post-op Follow-up Note (Cosign Needed)
Anesthesia QCDR form completed.        

## 2017-01-30 NOTE — Anesthesia Preprocedure Evaluation (Addendum)
Anesthesia Evaluation  Patient identified by MRN, date of birth, ID band Patient awake    Reviewed: Allergy & Precautions, NPO status , Patient's Chart, lab work & pertinent test results, reviewed documented beta blocker date and time   Airway Mallampati: III  TM Distance: >3 FB     Dental  (+) Chipped   Pulmonary asthma , sleep apnea , COPD, Current Smoker,           Cardiovascular      Neuro/Psych PSYCHIATRIC DISORDERS Anxiety    GI/Hepatic GERD  Controlled,  Endo/Other    Renal/GU      Musculoskeletal   Abdominal   Peds  Hematology   Anesthesia Other Findings Does not use CPAP.  Reproductive/Obstetrics                            Anesthesia Physical Anesthesia Plan  ASA: IV  Anesthesia Plan: Spinal   Post-op Pain Management:    Induction:   Airway Management Planned:   Additional Equipment:   Intra-op Plan:   Post-operative Plan:   Informed Consent: I have reviewed the patients History and Physical, chart, labs and discussed the procedure including the risks, benefits and alternatives for the proposed anesthesia with the patient or authorized representative who has indicated his/her understanding and acceptance.     Plan Discussed with: CRNA  Anesthesia Plan Comments:         Anesthesia Quick Evaluation

## 2017-01-30 NOTE — H&P (Signed)
Garrett Park is an 51 y.o. male.    Chief Complaint: Right hip pain  HPI: 51 y/o male fell off a crate at home last night and landed on right hip.  Exam and x-rays in the ER showed a non displaced subcapital fx of the right hip.  Patient is admitted for surgical fixation of the fracture to prevent displacement.  Risks and benefits and post op protocol discussed with patient and wife who agree with proceeding with surgery this afternoon.   Past Medical History:  Diagnosis Date  . Anxiety   . Asthma   . Chronic low back pain   . Dyslipidemia   . GERD (gastroesophageal reflux disease)   . Neck pain   . OSA (obstructive sleep apnea)    intolerant of cpap  . OSA and COPD overlap syndrome ([place]) 01/09/2016    Past Surgical History:  Procedure Laterality Date  . COLONOSCOPY    . ESOPHAGOGASTRODUODENOSCOPY      Family History  Problem Relation Age of Onset  . Diabetes Mellitus II Father    Social History:  reports that he has been smoking Cigarettes.  He has a 35.00 pack-year smoking history. He has never used smokeless tobacco. He reports that he does not drink alcohol or use drugs.  Allergies: No Known Allergies  Medications Prior to Admission  Medication Sig Dispense Refill  . acetaminophen (TYLENOL) 500 MG tablet Take 500 mg by mouth every 6 (six) hours as needed.    Marland Kitchen HYDROcodone-acetaminophen (NORCO/VICODIN) 5-325 MG tablet Take 1 tablet by mouth 3 (three) times daily as needed for moderate pain.    Marland Kitchen omeprazole (PRILOSEC) 20 MG capsule Take 20 mg by mouth daily.      Results for orders placed or performed during the hospital encounter of 01/29/17 (from the past 48 hour(s))  Type and screen     Status: None   Collection Time: 01/29/17  8:32 PM  Result Value Ref Range   ABO/RH(D) O NEG    Antibody Screen NEG    Sample Expiration 02/01/2017   Comprehensive metabolic panel     Status: Abnormal   Collection Time: 01/29/17  8:47 PM  Result Value Ref Range   Sodium 131 (L)  135 - 145 mmol/L   Potassium 3.7 3.5 - 5.1 mmol/L   Chloride 98 (L) 101 - 111 mmol/L   CO2 26 22 - 32 mmol/L   Glucose, Bld 100 (H) 65 - 99 mg/dL   BUN 12 6 - 20 mg/dL   Creatinine, Ser 1.25 (H) 0.61 - 1.24 mg/dL   Calcium 8.3 (L) 8.9 - 10.3 mg/dL   Total Protein 7.4 6.5 - 8.1 g/dL   Albumin 3.6 3.5 - 5.0 g/dL   AST 37 15 - 41 U/L   ALT 47 17 - 63 U/L   Alkaline Phosphatase 69 38 - 126 U/L   Total Bilirubin 0.4 0.3 - 1.2 mg/dL   GFR calc non Af Amer >60 >60 mL/min   GFR calc Af Amer >60 >60 mL/min    Comment: (NOTE) The eGFR has been calculated using the CKD EPI equation. This calculation has not been validated in all clinical situations. eGFR's persistently <60 mL/min signify possible Chronic Kidney Disease.    Anion gap 7 5 - 15  CBC with Differential     Status: Abnormal   Collection Time: 01/29/17  8:47 PM  Result Value Ref Range   WBC 10.5 3.8 - 10.6 K/uL   RBC 4.18 (L) 4.40 -  5.90 MIL/uL   Hemoglobin 14.0 13.0 - 18.0 g/dL   HCT 39.9 (L) 40.0 - 52.0 %   MCV 95.3 80.0 - 100.0 fL   MCH 33.4 26.0 - 34.0 pg   MCHC 35.0 32.0 - 36.0 g/dL   RDW 12.9 11.5 - 14.5 %   Platelets 166 150 - 440 K/uL   Neutrophils Relative % 66 %   Neutro Abs 7.0 (H) 1.4 - 6.5 K/uL   Lymphocytes Relative 23 %   Lymphs Abs 2.4 1.0 - 3.6 K/uL   Monocytes Relative 7 %   Monocytes Absolute 0.7 0.2 - 1.0 K/uL   Eosinophils Relative 3 %   Eosinophils Absolute 0.3 0 - 0.7 K/uL   Basophils Relative 1 %   Basophils Absolute 0.1 0 - 0.1 K/uL  Protime-INR     Status: None   Collection Time: 01/29/17  8:47 PM  Result Value Ref Range   Prothrombin Time 12.2 11.4 - 15.2 seconds   INR 0.91   APTT     Status: None   Collection Time: 01/29/17  8:47 PM  Result Value Ref Range   aPTT 29 24 - 36 seconds  Surgical PCR screen     Status: Abnormal   Collection Time: 01/29/17 11:30 PM  Result Value Ref Range   MRSA, PCR NEGATIVE NEGATIVE   Staphylococcus aureus POSITIVE (A) NEGATIVE    Comment:        The  Xpert SA Assay (FDA approved for NASAL specimens in patients over 26 years of age), is one component of a comprehensive surveillance program.  Test performance has been validated by [facility] for patients greater than or equal to 4 year old. It is not intended to diagnose infection nor to guide or monitor treatment.   CBC     Status: Abnormal   Collection Time: 01/30/17  3:51 AM  Result Value Ref Range   WBC 10.1 3.8 - 10.6 K/uL   RBC 4.13 (L) 4.40 - 5.90 MIL/uL   Hemoglobin 13.7 13.0 - 18.0 g/dL   HCT 39.5 (L) 40.0 - 52.0 %   MCV 95.6 80.0 - 100.0 fL   MCH 33.3 26.0 - 34.0 pg   MCHC 34.8 32.0 - 36.0 g/dL   RDW 13.0 11.5 - 14.5 %   Platelets 145 (L) 150 - 440 K/uL  Basic metabolic panel     Status: Abnormal   Collection Time: 01/30/17  3:51 AM  Result Value Ref Range   Sodium 137 135 - 145 mmol/L   Potassium 3.8 3.5 - 5.1 mmol/L   Chloride 106 101 - 111 mmol/L   CO2 26 22 - 32 mmol/L   Glucose, Bld 129 (H) 65 - 99 mg/dL   BUN 13 6 - 20 mg/dL   Creatinine, Ser 1.01 0.61 - 1.24 mg/dL   Calcium 8.3 (L) 8.9 - 10.3 mg/dL   GFR calc non Af Amer >60 >60 mL/min   GFR calc Af Amer >60 >60 mL/min    Comment: (NOTE) The eGFR has been calculated using the CKD EPI equation. This calculation has not been validated in all clinical situations. eGFR's persistently <60 mL/min signify possible Chronic Kidney Disease.    Anion gap 5 5 - 15   Dg Chest 1 View  Result Date: 01/29/2017 CLINICAL DATA:  Right hip pain status post fall. Right hip fracture. EXAM: CHEST 1 VIEW COMPARISON:  03/12/2008. FINDINGS: The heart size and mediastinal contours are normal. The lungs are clear. There is no pleural effusion  or pneumothorax. No acute osseous findings are identified. IMPRESSION: No active cardiopulmonary process. No evidence of acute chest injury. Electronically Signed   By: Richardean Sale M.D.   On: 01/29/2017 20:11   Dg Hip Unilat W Or Wo Pelvis 2-3 Views Right  Result Date:  01/29/2017 CLINICAL DATA:  Right hip pain since falling approximately 2 hours prior to admission. EXAM: DG HIP (WITH OR WITHOUT PELVIS) 2-3V RIGHT COMPARISON:  None. FINDINGS: The bones appear adequately mineralized. There is a nondisplaced acute fracture of the right femoral neck. No evidence of dislocation, pelvic fracture or femoral head avascular necrosis. No significant underlying hip arthropathy. IMPRESSION: Nondisplaced fracture of the right femoral neck. Electronically Signed   By: Richardean Sale M.D.   On: 01/29/2017 20:10    ROS  Blood pressure (!) 142/86, pulse 91, temperature 99.1 F (37.3 C), temperature source Oral, resp. rate 19, height 5' 10"  (1.778 m), weight 81.6 kg (180 lb), SpO2 99 %.   Physical Exam  Alert and oriented.  Pain with rom of right hip.  No shortening.  Some external rotation.  CSM and skin intact.    Assessment/Plan Non displaced right subcapital hip fx.   Park Breed, MD 01/30/2017, 11:32 AM

## 2017-01-30 NOTE — NC FL2 (Signed)
[place] MEDICAID FL2 LEVEL OF CARE SCREENING TOOL     IDENTIFICATION  Patient Name: Garrett Park Birthdate: [birth year] Sex: male Admission Date (Current Location): 01/29/2017  [place] and [place] Number:  Chiropodist and Address:  [facility], [address], [place], [place] [address]      Provider Number: [id number]  Attending Physician Name and Address:  Deeann Saint, MD  Relative Name and Phone Number:       Current Level of Care: Hospital Recommended Level of Care: Skilled Nursing Facility Prior Approval Number:    Date Approved/Denied:   PASRR Number:  [id number])  Discharge Plan: SNF    Current Diagnoses: Patient Active Problem List   Diagnosis Date Noted  . Closed right hip fracture, initial encounter (HCC) 01/29/2017  . OSA and COPD overlap syndrome (HCC) 01/09/2016  . Snoring 01/09/2016  . Gasping for breath 01/09/2016  . Non-compliance with treatment 01/09/2016  . Obstructive sleep apnea 01/09/2016  . Chronic narcotic use 01/09/2016  . Tobacco use disorder 01/09/2016    Orientation RESPIRATION BLADDER Height & Weight     Self, Time, Situation, Place  Normal Continent Weight: 180 lb (81.6 kg) Height:   (177.8 cm)  BEHAVIORAL SYMPTOMS/MOOD NEUROLOGICAL BOWEL NUTRITION STATUS   (none)  (none) Continent Diet (Diet: NPO for surgery )  AMBULATORY STATUS COMMUNICATION OF NEEDS Skin   Extensive Assist Verbally Normal                       Personal Care Assistance Level of Assistance  Bathing, Feeding, Dressing Bathing Assistance: Limited assistance Feeding assistance: Independent Dressing Assistance: Limited assistance     Functional Limitations Info  Sight, Hearing, Speech Sight Info: Adequate Hearing Info: Adequate Speech Info: Adequate    SPECIAL CARE FACTORS FREQUENCY  PT (By licensed PT), OT (By licensed OT)     PT Frequency:  (5) OT Frequency:  (5)            Contractures       Additional Factors Info  Code Status, Allergies Code Status Info:  (Full Code. ) Allergies Info:  (No Known Allergies. )           Current Medications (01/30/2017):  This is the current hospital active medication list Current Facility-Administered Medications  Medication Dose Route Frequency Provider Last Rate Last Dose  . 0.45 % sodium chloride infusion   Intravenous Continuous Deeann Saint, MD 75 mL/hr at 01/30/17 1048    . [MAR Hold] ceFAZolin (ANCEF) IVPB 2g/100 mL premix  2 g Intravenous On Call to OR Deeann Saint, MD      . Mitzi Hansen Hold] clindamycin (CLEOCIN) IVPB 600 mg  600 mg Intravenous On Call to OR Deeann Saint, MD      . Mitzi Hansen Hold] HYDROcodone-acetaminophen (NORCO/VICODIN) 5-325 MG per tablet 1 tablet  1 tablet Oral TID PRN Deeann Saint, MD      . Mitzi Hansen Hold] methocarbamol (ROBAXIN) tablet 500 mg  500 mg Oral Q6H PRN Deeann Saint, MD       Or  . Mitzi Hansen Hold] methocarbamol (ROBAXIN) 500 mg in dextrose 5 % 50 mL IVPB  500 mg Intravenous Q6H PRN Deeann Saint, MD      . Mitzi Hansen Hold] morphine 2 MG/ML injection 2 mg  2 mg Intravenous Q2H PRN Deeann Saint, MD   2 mg at 01/30/17 1040  . [MAR Hold] pantoprazole (PROTONIX) EC tablet 40 mg  40 mg Oral Daily Deeann Saint, MD  40 mg at 01/29/17 2325  . [MAR Hold] senna (SENOKOT) tablet 8.6 mg  1 tablet Oral BID Deeann Saint, MD   8.6 mg at 01/29/17 2325  . [MAR Hold] senna-docusate (Senokot-S) tablet 1 tablet  1 tablet Oral QHS PRN Deeann Saint, MD      . Mitzi Hansen Hold] zolpidem (AMBIEN) tablet 5 mg  5 mg Oral QHS PRN,MR X 1 Deeann Saint, MD         Discharge Medications: Please see discharge summary for a list of discharge medications.  Relevant Imaging Results:  Relevant Lab Results:   Additional Information  (SSN: [id number])  Ivianna Notch, Darleen Crocker, LCSW

## 2017-01-30 NOTE — OR Nursing (Signed)
Ancef  & Cleocin sent to OR with patient

## 2017-01-30 NOTE — Clinical Social Work Note (Signed)
Clinical Social Work Assessment  Patient Details  Name: Garrett Park MRN: [id number] Date of Birth: [birth year]  Date of referral:  01/30/17               Reason for consult:  [employer], Medical sales representative sought to share information with:    Permission granted to share information::     Name::      Kiln::     Relationship::     Contact Information:     Housing/Transportation Living arrangements for the past 2 months:  Single Family Home Source of Information:  Patient, Spouse, Adult Children Patient Interpreter Needed:  None Criminal Activity/Legal Involvement Pertinent to Current Situation/Hospitalization:  No - Comment as needed Significant Relationships:  Adult Children, Spouse Lives with:  Spouse Do you feel safe going back to the place where you live?  Yes Need for family participation in patient care:  Yes (Comment)  Care giving concerns:  Patient lives in [place] with his wife Garrett Park [phone].    Social Worker assessment / plan:  Holiday representative (Milo) reviewed chart and noted that patient has a hip fracture and will likely have surgery today. CSW met with patient and his wife Garrett Park and his mother in law were at bedside. Patient was alert and oriented and was laying in the bed. CSW introduced self and explained role of CSW department. Patient reported that he lives in [place] with his wife, works full time and is independent. CSW explained that PT will evaluate patient after surgery and make a recommendation. Patient does not want SNF and prefers home health. RN case manager aware of above. CSW will continue to follow and assist as needed.   Employment status:  [employer] information:  Managed Care PT Recommendations:  Not assessed at this time Information / Referral to community resources:  Other (Comment Required) (Home Health )  Patient/Family's Response to care:  Patient prefers home health.   Patient/Family's Understanding of and Emotional Response to Diagnosis, Current Treatment, and Prognosis:  Patient and wife were very pleasant and thanked CSW for visit.   Emotional Assessment Appearance:  Appears stated age Attitude/Demeanor/Rapport:    Affect (typically observed):  Accepting, Adaptable, Pleasant Orientation:  Oriented to Self, Oriented to Place, Oriented to  Time, Oriented to Situation Alcohol / Substance use:  Not Applicable Psych involvement (Current and /or in the community):  No (Comment)  Discharge Needs  Concerns to be addressed:  Discharge Planning Concerns Readmission within the last 30 days:  No Current discharge risk:  Dependent with Mobility Barriers to Discharge:  Continued Medical Work up   [employer], Veronia Beets, LCSW 01/30/2017, 1:46 PM

## 2017-01-30 NOTE — Anesthesia Procedure Notes (Signed)
Spinal  Patient location during procedure: OR Staffing Anesthesiologist: Berdine Addison Performed: anesthesiologist  Preanesthetic Checklist Completed: patient identified, site marked, surgical consent, pre-op evaluation, timeout performed, IV checked and risks and benefits discussed Spinal Block Patient position: sitting Prep: Betadine Patient monitoring: heart rate, cardiac monitor, continuous pulse ox and blood pressure Approach: midline Location: L3-4 Injection technique: single-shot Needle Needle type: Pencil-Tip  Needle gauge: 25 G Needle length: 9 cm Assessment Sensory level: T10 Additional Notes 1531 marcaine 1.30ml.

## 2017-01-30 NOTE — H&P (Signed)
THE PATIENT WAS SEEN PRIOR TO SURGERY TODAY.  HISTORY, ALLERGIES, HOME MEDICATIONS AND OPERATIVE PROCEDURE WERE REVIEWED. RISKS AND BENEFITS OF SURGERY DISCUSSED WITH PATIENT AGAIN.  NO CHANGES FROM INITIAL HISTORY AND PHYSICAL NOTED.    

## 2017-01-31 ENCOUNTER — Encounter: Payer: Self-pay | Admitting: Specialist

## 2017-01-31 LAB — BASIC METABOLIC PANEL
ANION GAP: 8 (ref 5–15)
BUN: 11 mg/dL (ref 6–20)
CHLORIDE: 103 mmol/L (ref 101–111)
CO2: 25 mmol/L (ref 22–32)
Calcium: 8.2 mg/dL — ABNORMAL LOW (ref 8.9–10.3)
Creatinine, Ser: 1.08 mg/dL (ref 0.61–1.24)
GFR calc Af Amer: >60 mL/min (ref >60)
GLUCOSE: 112 mg/dL — AB (ref 65–99)
Potassium: 3.9 mmol/L (ref 3.5–5.1)
Sodium: 136 mmol/L (ref 135–145)

## 2017-01-31 LAB — CBC
HCT: 41 % (ref 40.0–52.0)
HEMOGLOBIN: 14.1 g/dL (ref 13.0–18.0)
MCH: 33.1 pg (ref 26.0–34.0)
MCHC: 34.4 g/dL (ref 32.0–36.0)
MCV: 96.5 fL (ref 80.0–100.0)
PLATELETS: 132 10*3/uL — AB (ref 150–440)
RBC: 4.25 MIL/uL — ABNORMAL LOW (ref 4.40–5.90)
RDW: 12.9 % (ref 11.5–14.5)
WBC: 7.9 10*3/uL (ref 3.8–10.6)

## 2017-01-31 LAB — VITAMIN D 25 HYDROXY (VIT D DEFICIENCY, FRACTURES): VIT D 25 HYDROXY: 24.4 ng/mL — AB (ref 30.0–100.0)

## 2017-01-31 LAB — HIV ANTIBODY (ROUTINE TESTING W REFLEX): HIV Screen 4th Generation wRfx: NONREACTIVE

## 2017-01-31 MED ORDER — MELOXICAM 15 MG PO TABS: 15.0000 mg | ORAL_TABLET | Freq: Every day | ORAL | 3 refills | Status: DC

## 2017-01-31 MED ORDER — HYDROCODONE-ACETAMINOPHEN 7.5-325 MG PO TABS: 1.0000 | ORAL_TABLET | Freq: Four times a day (QID) | ORAL | 0 refills | Status: DC | PRN

## 2017-01-31 MED ORDER — GABAPENTIN 400 MG PO CAPS
400.0000 mg | ORAL_CAPSULE | Freq: Two times a day (BID) | ORAL | 3 refills | Status: DC
Start: 2017-01-31 — End: 2020-03-31

## 2017-01-31 NOTE — Progress Notes (Signed)
PT is recommending home health. RN case manager aware of above. Please reconsult if future social work needs arise. CSW signing off.   Raissa Dam, LCSW [phone]  

## 2017-01-31 NOTE — Anesthesia Postprocedure Evaluation (Signed)
Anesthesia Post Note  Patient: Garrett Park  Procedure(s) Performed: Procedure(s) (LRB): CANNULATED HIP PINNING (Right)  Patient location during evaluation: Nursing Unit Anesthesia Type: Spinal Level of consciousness: awake and alert and oriented Pain management: pain level controlled Vital Signs Assessment: post-procedure vital signs reviewed and stable Respiratory status: spontaneous breathing Cardiovascular status: stable Postop Assessment: no signs of nausea or vomiting and adequate PO intake Anesthetic complications: no     Last Vitals:  Vitals:   01/30/17 2110 01/30/17 2346  BP: 128/83 115/71  Pulse: 86 80  Resp: 18 18  Temp: 37.8 C 37.4 C    Last Pain:  Vitals:   01/31/17 0643  TempSrc:   PainSc: 2                  Rica Mast

## 2017-01-31 NOTE — Progress Notes (Signed)
Subjective: 1 Day Post-Op Procedure(s) (LRB): CANNULATED HIP PINNING (Right)    Patient reports pain as mild.  Objective:   VITALS:   Vitals:   01/30/17 2346 01/31/17 0730  BP: 115/71 122/78  Pulse: 80 72  Resp: 18 18  Temp: 99.3 F (37.4 C) 98.6 F (37 C)    Neurologically intact ABD soft Neurovascular intact Sensation intact distally Intact pulses distally Dorsiflexion/Plantar flexion intact Incision: no drainage  LABS  Recent Labs  01/29/17 2047 01/30/17 0351 01/31/17 0428  HGB 14.0 13.7 14.1  HCT 39.9* 39.5* 41.0  WBC 10.5 10.1 7.9  PLT 166 145* 132*     Recent Labs  01/29/17 2047 01/30/17 0351 01/31/17 0428  NA 131* 137 136  K 3.7 3.8 3.9  BUN CREATININE 1.25* 1.01 1.08  GLUCOSE 100* 129* 112*     Recent Labs  01/29/17 2047  INR 0.91     Assessment/Plan: 1 Day Post-Op Procedure(s) (LRB): CANNULATED HIP PINNING (Right)   Doing well with PT.   Ready for D/C Rtc 1 week

## 2017-01-31 NOTE — Discharge Summary (Signed)
Physician Discharge Summary  Patient ID: Garrett Park MRN: [id number] DOB/AGE: [birth year] 52 y.o.  Admit date: 01/29/2017 Discharge date: 01/31/2017  Admission Diagnoses: Non displaced right subcapital hip fx  Discharge Diagnoses: same  Active Problems:   Closed right hip fracture, initial encounter [facility])   Discharged Condition: good  Hospital Course: Admitted after fall at home with right hip fx.  Successful pinning of fx 01/30/17.  Doing well with PT today and ready to go home. Discussed need for non weight bearing for 6 weeks and monitoring of femoral head for a year to watch for AVN.  Consults: None  Significant Diagnostic Studies: radiology: X-Ray: Good position of fracture and pins post op  Treatments: antibiotics: Kefzol and cleocin  Discharge Exam: Blood pressure 122/78, pulse 72, temperature 98.6 F (37 C), temperature source Oral, resp. rate 18, height  (1.778 m), weight 81.6 kg (180 lb), SpO2 96 %. Incision/Wound:Clean and dry.  No reddness  Disposition: Final discharge disposition not confirmed  Discharge Instructions    Call MD for:  persistant nausea and vomiting    Complete by:  As directed    Call MD for:  redness, tenderness, or signs of infection (pain, swelling, redness, odor or green/yellow discharge around incision site)    Complete by:  As directed    Call MD for:  severe uncontrolled pain    Complete by:  As directed    Call MD for:  temperature >100.4    Complete by:  As directed    Diet - low sodium heart healthy    Complete by:  As directed    Discharge instructions    Complete by:  As directed    Toe touch only right leg for 6 weeks Ice right hip RTC 1 week   Increase activity slowly    Complete by:  As directed      Allergies as of 01/31/2017   No Known Allergies     Medication List    STOP taking these medications   acetaminophen 500 MG tablet Commonly known as:  TYLENOL   HYDROcodone-acetaminophen 5-325 MG tablet Commonly  known as:  NORCO/VICODIN Replaced by:  HYDROcodone-acetaminophen 7.5-325 MG tablet     TAKE these medications   gabapentin 400 MG capsule Commonly known as:  NEURONTIN Take 1 capsule (400 mg total) by mouth 2 (two) times daily.   HYDROcodone-acetaminophen 7.5-325 MG tablet Commonly known as:  NORCO Take 1 tablet by mouth every 6 (six) hours as needed for moderate pain. Replaces:  HYDROcodone-acetaminophen 5-325 MG tablet   meloxicam 15 MG tablet Commonly known as:  MOBIC Take 1 tablet (15 mg total) by mouth daily.   omeprazole 20 MG capsule Commonly known as:  PRILOSEC Take 20 mg by mouth daily.            Durable Medical Equipment        Start     Ordered   01/31/17 1237  For home use only DME Walker  [facility])  Once    Question:  Patient needs a walker to treat with the following condition  Answer:  Closed right hip fracture, initial encounter [facility])   01/31/17 1237     Follow-up Information    Garrett Daudelin E, MD. Schedule an appointment as soon as possible for a visit in 1 week(s).   Specialty:  Specialist Contact information: [address] [place] [address] [phone]           Signed: Valinda Hoar 01/31/2017, 12:39 PM

## 2017-01-31 NOTE — Evaluation (Signed)
Occupational Therapy Evaluation Patient Details Name: Garrett Park MRN: [id number] DOB: [birth year] Today's Date: 01/31/2017    History of Present Illness 51yo male pt s/p R hip pinning on 4/4, POD#1 for OT evaluation after fall in home (standing on unsecured surface to reach for items overhead) with PMHx significant for asthma, sleep apnea (does not wear CPAP), COPD, smoker, anxiety, GERD, and chronic lower back pain.   Clinical Impression   Pt seen for OT evaluation this date. Pt s/p R hip pinning, non weight-bearing precautions for RLE. Pt was indep at baseline and is eager to return to PLOF. Pt presents with pain in R hip, and impaired strength in RLE. Pt/spouse educated in home/routines modifications to support safe return home. Pt/spouse confident that spouse can provide any necessary assistance for self care tasks upon return home. Spouse educated in donning compression stockings. No additional OT needs at this time. Will sign off. Please re-consult if additional acute care needs arise.     Follow Up Recommendations  No OT follow up    Equipment Recommendations  Tub/shower bench;3 in 1 bedside commode    Recommendations for Other Services       Precautions / Restrictions Precautions Precautions: Fall Restrictions Weight Bearing Restrictions: Yes RLE Weight Bearing: Non weight bearing      Mobility Bed Mobility Overal bed mobility: Modified Independent             General bed mobility comments: did not assess, pt up in recliner for start of session  Transfers Overall transfer level: Modified independent Equipment used: Rolling walker (2 wheeled)             General transfer comment: no assist required    Balance Overall balance assessment: Modified Independent                                         ADL either performed or assessed with clinical judgement   ADL Overall ADL's : Needs assistance/impaired             Lower Body  Bathing: Supervison/ safety;Sit to/from stand;Sitting/lateral leans;With caregiver independent assisting   Upper Body Dressing : Sitting;Set up   Lower Body Dressing: Minimal assistance;Sitting/lateral leans;Sit to/from stand;With caregiver independent assisting;Set up Lower Body Dressing Details (indicate cue type and reason): pt/spouse educated in compensatory strategies to don compression stockings with both verbalizing understanding; pt able to don/doff socks indep with some difficulty               General ADL Comments: pt generally supervision - PRN min assist level for LB ADL     Vision Baseline Vision/History: Wears glasses Wears Glasses: Distance only (at night for driving) Patient Visual Report: No change from baseline Vision Assessment?: No apparent visual deficits     Perception     Praxis Praxis Praxis tested?: Within functional limits    Pertinent Vitals/Pain Pain Assessment: 0-10 Pain Score: 3  Pain Location: R hip Pain Descriptors / Indicators: Aching Pain Intervention(s): Limited activity within patient's tolerance;Monitored during session;Repositioned     Hand Dominance Right   Extremity/Trunk Assessment Upper Extremity Assessment Upper Extremity Assessment: Overall WFL for tasks assessed   Lower Extremity Assessment Lower Extremity Assessment: Defer to PT evaluation   Cervical / Trunk Assessment Cervical / Trunk Assessment: Normal   Communication Communication Communication: No difficulties   Cognition Arousal/Alertness: Awake/alert Behavior During Therapy: WFL for tasks  assessed/performed Overall Cognitive Status: Within Functional Limits for tasks assessed                                     General Comments       Exercises Other Exercises Other Exercises: pt/spouse educated in home/routines modifications including bathroom set up to support safe return home, showed pictures of recommended equipment (TTB) to spouse    Shoulder Instructions      Home Living Family/patient expects to be discharged to:: Private residence Living Arrangements: Spouse/significant other;Children (large dog) Available Help at Discharge: Family;Available PRN/intermittently (pt likely will be home alone for periods of time throughout the day, family local who could come by as needed) Type of Home: House Home Access: Stairs to enter [employer] of Steps: 2 Entrance Stairs-Rails: Can reach both Home Layout: One level     Bathroom Shower/Tub: Tub/shower unit;Curtain (has walk-in but very small, no grab bars, no room for chair)   Bathroom Toilet: Standard Bathroom Accessibility: Yes How Accessible: Accessible via walker Home Equipment: None          Prior Functioning/Environment Level of Independence: Independent        Comments: pt indep with ADL, IADL, including driving, working as a Insurance underwriter, 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 (leading to hip pinning)        OT Problem List:        OT Treatment/Interventions:      OT Goals(Current goals can be found in the care plan section) Acute Rehab OT Goals Patient Stated Goal: go home OT Goal Formulation: With patient/family Time For Goal Achievement: 02/07/17 Potential to Achieve Goals: Good  OT Frequency:     Barriers to D/C:            Co-evaluation              End of Session    Activity Tolerance: Patient tolerated treatment well Patient left: in chair;with call bell/phone within reach;with chair alarm set;with family/visitor present  OT Visit Diagnosis: Other abnormalities of gait and mobility (R26.89);History of falling (Z91.81);Pain Pain - Right/Left: Right Pain - part of body: Hip                Time: 1047-1110 OT Time Calculation (min): 23 min Charges:  OT General Charges $OT Visit: 1 Procedure OT Evaluation $OT Eval Low Complexity: 1 Procedure OT Treatments $Self Care/Home Management : 8-22 mins G-Codes:     Richrd Prime,  MPH, MS, OTR/L ascom [phone] 01/31/17, 1:16 PM

## 2017-01-31 NOTE — Care Management Note (Signed)
Case Management Note  Patient Details  Name: Garrett Park MRN: [id number] Date of Birth: January 06, 1966  Subjective/Objective:  POD # 1 right hip pinning. Met with patint and his spouse at bedside. Prior to admission, patient was independent and active. Discussed home health and discharge needs. Offered choice of home health agencies. [facility] to [facility]. He will need a walker and BSC. Ordered from [facility]. PCP is Bevelyn Buckles. He was last seen for a physical in Febuary                 Action/Plan: [facility] for PT, walker and BSC.   Expected Discharge Date:  02/01/17               Expected Discharge Plan:  [place]  In-House Referral:     Discharge planning Services  CM Consult  Post Acute Care Choice:  Durable Medical Equipment, Home Health Choice offered to:  Patient, Spouse  DME Arranged:  Bedside commode, Walker rolling DME Agency:  [place]:  PT [place]:  [place]  Status of Service:  In process, will continue to follow  If discussed at Long Length of Stay Meetings, dates discussed:    Additional Comments:  [name], RN 01/31/2017, 9:42 AM

## 2017-01-31 NOTE — Progress Notes (Signed)
Patient was discharged home with wife. Reviewed discharge instructions including activity, diet, and wound care. Sent an extra dressing home. Patient had BSC and walker delivered to take home. Notified MD that there wasn't a anticoagulant ordered. MD stated to send patient home on ASA  PO daily. Discussed this with patient and wrote it on discharge paperwork. Allowed time for questions. IV removed with cath intact.

## 2017-01-31 NOTE — Evaluation (Signed)
Physical Therapy Evaluation Patient Details Name: Garrett Park MRN: [id number] DOB: [birth year] Today's Date: 01/31/2017   History of Present Illness  50yo male pt s/p R hip pinning on 4/4, after fall/hip fracture in home (standing on unsecured surface to reach for items overhead) with PMHx significant for asthma, sleep apnea (does not wear CPAP), COPD, smoker, anxiety, GERD, and chronic lower back pain.  Clinical Impression  Pt did well with ability to keep weight off R LE during ambulation into the hallway and generally showed good functional strength with mobility and transfers.  Pt will have to be home alone for long stretches at a time while family is at work/school but should be safe with appropriate equipment and thoughtful set up.  Overall pt showed good effort and was eager to work with PT to be able to get home.     Follow Up Recommendations Home health PT    Equipment Recommendations  Rolling walker with 5" wheels;3in1 (PT)    Recommendations for Other Services       Precautions / Restrictions Precautions Precautions: Fall Restrictions RLE Weight Bearing: Non weight bearing      Mobility  Bed Mobility Overal bed mobility: Modified Independent             General bed mobility comments: Pt able to use UEs and core to get to EOB w/o assist  Transfers Overall transfer level: Modified independent Equipment used: Rolling walker (2 wheeled)             General transfer comment: Pt was able to rise to standing without assist and generally showed good effort  Ambulation/Gait Ambulation/Gait assistance: Min guard Ambulation Distance (Feet): 70 Feet Assistive device: Rolling walker (2 wheeled)       General Gait Details: Pt was able to maintain NWBing on the R relatively well and though he had some fatigue his UE strength and good balance allowed him to manage the effort safely  Stairs            Wheelchair Mobility    Modified Rankin (Stroke Patients  Only)       Balance Overall balance assessment: Modified Independent                                           Pertinent Vitals/Pain Pain Assessment: 0-10 Pain Score: 3  Pain Location: R hip    Home Living Family/patient expects to be discharged to:: Private residence Living Arrangements: Spouse/significant other;Children Available Help at Discharge: Family (Pt will likely be alone for long stretches t/o the day)   Home Access: Stairs to enter Entrance Stairs-Rails: Can reach both Entrance Stairs-Number of Steps: 2 Home Layout: One level        Prior Function Level of Independence: Independent         Comments: Pt able to be active, do all he needed     Hand Dominance        Extremity/Trunk Assessment   Upper Extremity Assessment Upper Extremity Assessment: Overall WFL for tasks assessed    Lower Extremity Assessment Lower Extremity Assessment: Overall WFL for tasks assessed (expected post-op weakness R hip (grossly 3/5) otherwise [facility])       Communication   Communication: No difficulties  Cognition Arousal/Alertness: Awake/alert Behavior During Therapy: WFL for tasks assessed/performed Overall Cognitive Status: Within Functional Limits for tasks assessed  General Comments      Exercises General Exercises - Lower Extremity Ankle Circles/Pumps: AROM;Strengthening;10 reps (resisted DF) Quad Sets: Strengthening;10 reps Gluteal Sets: Strengthening;10 reps Short Arc Quad: Strengthening;10 reps Heel Slides: AROM;10 reps Hip ABduction/ADduction: Strengthening;10 reps Straight Leg Raises: AROM;10 reps   Assessment/Plan    PT Assessment Patient needs continued PT services  PT Problem List Decreased strength;Decreased range of motion;Decreased activity tolerance;Decreased balance;Decreased mobility;Decreased knowledge of use of DME;Decreased safety awareness;Decreased knowledge of  precautions;Pain       PT Treatment Interventions DME instruction;Gait training;Stair training;Functional mobility training;Therapeutic activities;Therapeutic exercise;Balance training;Patient/family education    PT Goals (Current goals can be found in the Care Plan section)  Acute Rehab PT Goals Patient Stated Goal: go home PT Goal Formulation: With patient Time For Goal Achievement: 02/14/17 Potential to Achieve Goals: Good    Frequency BID   Barriers to discharge        Co-evaluation               End of Session Equipment Utilized During Treatment: Gait belt Activity Tolerance: Patient limited by fatigue;Patient tolerated treatment well Patient left: with chair alarm set;with call bell/phone within reach;with family/visitor present Nurse Communication: Mobility status PT Visit Diagnosis: Difficulty in walking, not elsewhere classified (R26.2);Muscle weakness (generalized) (M62.81)    Time: 9604-5409 PT Time Calculation (min) (ACUTE ONLY): 34 min   Charges:   PT Evaluation $PT Eval Low Complexity: 1 Procedure PT Treatments $Gait Training: 8-22 mins $Therapeutic Exercise: 8-22 mins   PT G Codes:        Malachi Pro, DPT 01/31/2017, 12:11 PM

## 2017-01-31 NOTE — Progress Notes (Signed)
qPhysical Therapy Treatment Patient Details Name: Garrett Park MRN: [id number] DOB: [birth year] Today's Date: 01/31/2017    History of Present Illness 50yo male pt s/p R hip pinning on 4/4, POD#1 for OT evaluation after fall in home (standing on unsecured surface to reach for items overhead) with PMHx significant for asthma, sleep apnea (does not wear CPAP), COPD, smoker, anxiety, GERD, and chronic lower back pain.    PT Comments    Pt showed good effort with ambulation, steps and exercises. He was able to do all bed mobility w/o assist and after some warm up exercises was actually able to do some SLRs w/o assist.  He was able to keep weight off R LE, but did need reminders during transitions to insure that he didn't move too quick, forget and put foot down.  Pt voices understanding, safe to go home.    Follow Up Recommendations  Home health PT     Equipment Recommendations  Rolling walker with 5" wheels;3in1 (PT)    Recommendations for Other Services Rehab consult     Precautions / Restrictions Precautions Precautions: Fall Restrictions Weight Bearing Restrictions: Yes RLE Weight Bearing: Touchdown weight bearing    Mobility  Bed Mobility Overal bed mobility: Modified Independent             General bed mobility comments: Pt able to get up to EOB and then back to supine w/o direct assist, though clearly he needed a lot of effort to perform  Transfers Overall transfer level: Modified independent Equipment used: Rolling walker (2 wheeled)             General transfer comment: Pt needing reminders to use UEs appropriate and initially seemed ready to try and stand w/o AD - tries to impress on him that he needs to be more deliberate with transfers as his WBing status is more limiting.  Ambulation/Gait Ambulation/Gait assistance: Supervision Ambulation Distance (Feet): 75 Feet Assistive device: Rolling walker (2 wheeled)       General Gait Details: Pt again did  well with ambulation and was able to maintain WBing and overall showed good confidence despite some minimal fatigue.    Stairs Stairs: Yes   Stair Management: One rail Right Number of Stairs: 4 General stair comments: Pt was able to negotiate up/down steps w/o assist, did need reminders and cuing to appropriately negotiate up/down steps  Wheelchair Mobility    Modified Rankin (Stroke Patients Only)       Balance Overall balance assessment: Modified Independent                                          Cognition Arousal/Alertness: Awake/alert Behavior During Therapy: WFL for tasks assessed/performed Overall Cognitive Status: Within Functional Limits for tasks assessed                                        Exercises General Exercises - Lower Extremity Ankle Circles/Pumps: AROM;Strengthening;10 reps (resisted DF) Quad Sets: Strengthening;10 reps Gluteal Sets: Strengthening;10 reps Short Arc Quad: Strengthening;10 reps Heel Slides: AROM;10 reps Straight Leg Raises: AAROM;5 reps Other Exercises Other Exercises: pt/spouse educated in home/routines modifications including bathroom set up to support safe return home, showed pictures of recommended equipment (TTB) to spouse    General Comments  Pertinent Vitals/Pain Pain Assessment: 0-10 Pain Score: 5  Pain Location: R hip Pain Descriptors / Indicators: Aching Pain Intervention(s): Limited activity within patient's tolerance;Monitored during session;Repositioned    Home Living Family/patient expects to be discharged to:: Private residence Living Arrangements: Spouse/significant other;Children (large dog) Available Help at Discharge: Family;Available PRN/intermittently (pt likely will be home alone for periods of time throughout the day, family local who could come by as needed) Type of Home: House Home Access: Stairs to enter Entrance Stairs-Rails: Can reach both Home Layout: One  level Home Equipment: None      Prior Function Level of Independence: Independent      Comments: pt indep with ADL, IADL, including driving, working as a Insurance underwriter, 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 (leading to hip pinning)   PT Goals (current goals can now be found in the care plan section) Acute Rehab PT Goals Patient Stated Goal: go home Progress towards PT goals: Progressing toward goals    Frequency    BID      PT Plan Current plan remains appropriate    Co-evaluation             End of Session Equipment Utilized During Treatment: Gait belt Activity Tolerance: Patient limited by fatigue;Patient tolerated treatment well Patient left: with bed alarm set;with call bell/phone within reach   PT Visit Diagnosis: Difficulty in walking, not elsewhere classified (R26.2);Muscle weakness (generalized) (M62.81)     Time: 1341-1410 PT Time Calculation (min) (ACUTE ONLY): 29 min  Charges:  $Gait Training: 8-22 mins $Therapeutic Exercise: 8-22 mins                    G Codes:        Malachi Pro, DPT 01/31/2017, 4:18 PM

## 2017-02-02 DIAGNOSIS — S72011D Unspecified intracapsular fracture of right femur, subsequent encounter for closed fracture with routine healing: Secondary | ICD-10-CM | POA: Diagnosis not present

## 2017-02-02 DIAGNOSIS — J45909 Unspecified asthma, uncomplicated: Secondary | ICD-10-CM | POA: Diagnosis not present

## 2017-02-02 DIAGNOSIS — F419 Anxiety disorder, unspecified: Secondary | ICD-10-CM | POA: Diagnosis not present

## 2017-02-02 DIAGNOSIS — G4733 Obstructive sleep apnea (adult) (pediatric): Secondary | ICD-10-CM | POA: Diagnosis not present

## 2017-02-02 DIAGNOSIS — K219 Gastro-esophageal reflux disease without esophagitis: Secondary | ICD-10-CM | POA: Diagnosis not present

## 2017-02-02 DIAGNOSIS — F17210 Nicotine dependence, cigarettes, uncomplicated: Secondary | ICD-10-CM | POA: Diagnosis not present

## 2017-02-02 DIAGNOSIS — M545 Low back pain: Secondary | ICD-10-CM | POA: Diagnosis not present

## 2017-02-02 DIAGNOSIS — G8929 Other chronic pain: Secondary | ICD-10-CM | POA: Diagnosis not present

## 2017-02-05 DIAGNOSIS — K219 Gastro-esophageal reflux disease without esophagitis: Secondary | ICD-10-CM | POA: Diagnosis not present

## 2017-02-05 DIAGNOSIS — G8929 Other chronic pain: Secondary | ICD-10-CM | POA: Diagnosis not present

## 2017-02-05 DIAGNOSIS — G4733 Obstructive sleep apnea (adult) (pediatric): Secondary | ICD-10-CM | POA: Diagnosis not present

## 2017-02-05 DIAGNOSIS — J45909 Unspecified asthma, uncomplicated: Secondary | ICD-10-CM | POA: Diagnosis not present

## 2017-02-05 DIAGNOSIS — F419 Anxiety disorder, unspecified: Secondary | ICD-10-CM | POA: Diagnosis not present

## 2017-02-05 DIAGNOSIS — S72011D Unspecified intracapsular fracture of right femur, subsequent encounter for closed fracture with routine healing: Secondary | ICD-10-CM | POA: Diagnosis not present

## 2017-02-05 DIAGNOSIS — F17210 Nicotine dependence, cigarettes, uncomplicated: Secondary | ICD-10-CM | POA: Diagnosis not present

## 2017-02-05 DIAGNOSIS — M545 Low back pain: Secondary | ICD-10-CM | POA: Diagnosis not present

## 2017-02-07 DIAGNOSIS — F419 Anxiety disorder, unspecified: Secondary | ICD-10-CM | POA: Diagnosis not present

## 2017-02-07 DIAGNOSIS — M25551 Pain in right hip: Secondary | ICD-10-CM | POA: Diagnosis not present

## 2017-02-07 DIAGNOSIS — K219 Gastro-esophageal reflux disease without esophagitis: Secondary | ICD-10-CM | POA: Diagnosis not present

## 2017-02-07 DIAGNOSIS — G4733 Obstructive sleep apnea (adult) (pediatric): Secondary | ICD-10-CM | POA: Diagnosis not present

## 2017-02-07 DIAGNOSIS — G8929 Other chronic pain: Secondary | ICD-10-CM | POA: Diagnosis not present

## 2017-02-07 DIAGNOSIS — F17210 Nicotine dependence, cigarettes, uncomplicated: Secondary | ICD-10-CM | POA: Diagnosis not present

## 2017-02-07 DIAGNOSIS — S72011D Unspecified intracapsular fracture of right femur, subsequent encounter for closed fracture with routine healing: Secondary | ICD-10-CM | POA: Diagnosis not present

## 2017-02-07 DIAGNOSIS — J45909 Unspecified asthma, uncomplicated: Secondary | ICD-10-CM | POA: Diagnosis not present

## 2017-02-07 DIAGNOSIS — M545 Low back pain: Secondary | ICD-10-CM | POA: Diagnosis not present

## 2017-02-11 DIAGNOSIS — K219 Gastro-esophageal reflux disease without esophagitis: Secondary | ICD-10-CM | POA: Diagnosis not present

## 2017-02-11 DIAGNOSIS — M545 Low back pain: Secondary | ICD-10-CM | POA: Diagnosis not present

## 2017-02-11 DIAGNOSIS — S72011D Unspecified intracapsular fracture of right femur, subsequent encounter for closed fracture with routine healing: Secondary | ICD-10-CM | POA: Diagnosis not present

## 2017-02-11 DIAGNOSIS — J45909 Unspecified asthma, uncomplicated: Secondary | ICD-10-CM | POA: Diagnosis not present

## 2017-02-11 DIAGNOSIS — F419 Anxiety disorder, unspecified: Secondary | ICD-10-CM | POA: Diagnosis not present

## 2017-02-11 DIAGNOSIS — F17210 Nicotine dependence, cigarettes, uncomplicated: Secondary | ICD-10-CM | POA: Diagnosis not present

## 2017-02-11 DIAGNOSIS — G8929 Other chronic pain: Secondary | ICD-10-CM | POA: Diagnosis not present

## 2017-02-11 DIAGNOSIS — G4733 Obstructive sleep apnea (adult) (pediatric): Secondary | ICD-10-CM | POA: Diagnosis not present

## 2017-02-15 DIAGNOSIS — S72011D Unspecified intracapsular fracture of right femur, subsequent encounter for closed fracture with routine healing: Secondary | ICD-10-CM | POA: Diagnosis not present

## 2017-02-15 DIAGNOSIS — M545 Low back pain: Secondary | ICD-10-CM | POA: Diagnosis not present

## 2017-02-15 DIAGNOSIS — F419 Anxiety disorder, unspecified: Secondary | ICD-10-CM | POA: Diagnosis not present

## 2017-02-15 DIAGNOSIS — G8929 Other chronic pain: Secondary | ICD-10-CM | POA: Diagnosis not present

## 2017-02-15 DIAGNOSIS — F17210 Nicotine dependence, cigarettes, uncomplicated: Secondary | ICD-10-CM | POA: Diagnosis not present

## 2017-02-15 DIAGNOSIS — J45909 Unspecified asthma, uncomplicated: Secondary | ICD-10-CM | POA: Diagnosis not present

## 2017-02-15 DIAGNOSIS — G4733 Obstructive sleep apnea (adult) (pediatric): Secondary | ICD-10-CM | POA: Diagnosis not present

## 2017-02-15 DIAGNOSIS — K219 Gastro-esophageal reflux disease without esophagitis: Secondary | ICD-10-CM | POA: Diagnosis not present

## 2017-02-20 DIAGNOSIS — F17210 Nicotine dependence, cigarettes, uncomplicated: Secondary | ICD-10-CM | POA: Diagnosis not present

## 2017-02-20 DIAGNOSIS — G8929 Other chronic pain: Secondary | ICD-10-CM | POA: Diagnosis not present

## 2017-02-20 DIAGNOSIS — M545 Low back pain: Secondary | ICD-10-CM | POA: Diagnosis not present

## 2017-02-20 DIAGNOSIS — F419 Anxiety disorder, unspecified: Secondary | ICD-10-CM | POA: Diagnosis not present

## 2017-02-20 DIAGNOSIS — K219 Gastro-esophageal reflux disease without esophagitis: Secondary | ICD-10-CM | POA: Diagnosis not present

## 2017-02-20 DIAGNOSIS — J45909 Unspecified asthma, uncomplicated: Secondary | ICD-10-CM | POA: Diagnosis not present

## 2017-02-20 DIAGNOSIS — G4733 Obstructive sleep apnea (adult) (pediatric): Secondary | ICD-10-CM | POA: Diagnosis not present

## 2017-02-20 DIAGNOSIS — S72011D Unspecified intracapsular fracture of right femur, subsequent encounter for closed fracture with routine healing: Secondary | ICD-10-CM | POA: Diagnosis not present

## 2017-02-27 DIAGNOSIS — M25551 Pain in right hip: Secondary | ICD-10-CM | POA: Diagnosis not present

## 2017-02-27 DIAGNOSIS — M545 Low back pain: Secondary | ICD-10-CM | POA: Diagnosis not present

## 2017-02-27 DIAGNOSIS — G8929 Other chronic pain: Secondary | ICD-10-CM | POA: Diagnosis not present

## 2017-02-27 DIAGNOSIS — G4733 Obstructive sleep apnea (adult) (pediatric): Secondary | ICD-10-CM | POA: Diagnosis not present

## 2017-02-27 DIAGNOSIS — K219 Gastro-esophageal reflux disease without esophagitis: Secondary | ICD-10-CM | POA: Diagnosis not present

## 2017-02-27 DIAGNOSIS — J45909 Unspecified asthma, uncomplicated: Secondary | ICD-10-CM | POA: Diagnosis not present

## 2017-02-27 DIAGNOSIS — F419 Anxiety disorder, unspecified: Secondary | ICD-10-CM | POA: Diagnosis not present

## 2017-02-27 DIAGNOSIS — F17210 Nicotine dependence, cigarettes, uncomplicated: Secondary | ICD-10-CM | POA: Diagnosis not present

## 2017-02-27 DIAGNOSIS — S72011D Unspecified intracapsular fracture of right femur, subsequent encounter for closed fracture with routine healing: Secondary | ICD-10-CM | POA: Diagnosis not present

## 2017-03-05 DIAGNOSIS — S72011D Unspecified intracapsular fracture of right femur, subsequent encounter for closed fracture with routine healing: Secondary | ICD-10-CM | POA: Diagnosis not present

## 2017-03-05 DIAGNOSIS — F17210 Nicotine dependence, cigarettes, uncomplicated: Secondary | ICD-10-CM | POA: Diagnosis not present

## 2017-03-05 DIAGNOSIS — M545 Low back pain: Secondary | ICD-10-CM | POA: Diagnosis not present

## 2017-03-05 DIAGNOSIS — F419 Anxiety disorder, unspecified: Secondary | ICD-10-CM | POA: Diagnosis not present

## 2017-03-05 DIAGNOSIS — G4733 Obstructive sleep apnea (adult) (pediatric): Secondary | ICD-10-CM | POA: Diagnosis not present

## 2017-03-05 DIAGNOSIS — J45909 Unspecified asthma, uncomplicated: Secondary | ICD-10-CM | POA: Diagnosis not present

## 2017-03-05 DIAGNOSIS — K219 Gastro-esophageal reflux disease without esophagitis: Secondary | ICD-10-CM | POA: Diagnosis not present

## 2017-03-05 DIAGNOSIS — G8929 Other chronic pain: Secondary | ICD-10-CM | POA: Diagnosis not present

## 2017-03-18 DIAGNOSIS — M25551 Pain in right hip: Secondary | ICD-10-CM | POA: Diagnosis not present

## 2017-03-18 DIAGNOSIS — M5136 Other intervertebral disc degeneration, lumbar region: Secondary | ICD-10-CM | POA: Diagnosis not present

## 2017-03-18 DIAGNOSIS — R05 Cough: Secondary | ICD-10-CM | POA: Diagnosis not present

## 2017-03-18 DIAGNOSIS — M545 Low back pain: Secondary | ICD-10-CM | POA: Diagnosis not present

## 2017-03-27 DIAGNOSIS — M25551 Pain in right hip: Secondary | ICD-10-CM | POA: Diagnosis not present

## 2017-04-24 DIAGNOSIS — M25551 Pain in right hip: Secondary | ICD-10-CM | POA: Diagnosis not present

## 2017-10-14 DIAGNOSIS — M25551 Pain in right hip: Secondary | ICD-10-CM | POA: Diagnosis not present

## 2017-12-09 DIAGNOSIS — Z0000 Encounter for general adult medical examination without abnormal findings: Secondary | ICD-10-CM | POA: Diagnosis not present

## 2017-12-09 DIAGNOSIS — Z125 Encounter for screening for malignant neoplasm of prostate: Secondary | ICD-10-CM | POA: Diagnosis not present

## 2017-12-12 DIAGNOSIS — Z1389 Encounter for screening for other disorder: Secondary | ICD-10-CM | POA: Diagnosis not present

## 2017-12-12 DIAGNOSIS — N486 Induration penis plastica: Secondary | ICD-10-CM | POA: Diagnosis not present

## 2017-12-12 DIAGNOSIS — Z0000 Encounter for general adult medical examination without abnormal findings: Secondary | ICD-10-CM | POA: Diagnosis not present

## 2017-12-12 DIAGNOSIS — M5136 Other intervertebral disc degeneration, lumbar region: Secondary | ICD-10-CM | POA: Diagnosis not present

## 2017-12-12 DIAGNOSIS — R740 Nonspecific elevation of levels of transaminase and lactic acid dehydrogenase [LDH]: Secondary | ICD-10-CM | POA: Diagnosis not present

## 2017-12-12 DIAGNOSIS — R5383 Other fatigue: Secondary | ICD-10-CM | POA: Diagnosis not present

## 2017-12-19 DIAGNOSIS — Z1212 Encounter for screening for malignant neoplasm of rectum: Secondary | ICD-10-CM | POA: Diagnosis not present

## 2017-12-20 ENCOUNTER — Other Ambulatory Visit: Payer: Self-pay | Admitting: Internal Medicine

## 2017-12-20 DIAGNOSIS — R740 Nonspecific elevation of levels of transaminase and lactic acid dehydrogenase [LDH]: Secondary | ICD-10-CM | POA: Diagnosis not present

## 2017-12-20 DIAGNOSIS — Z720 Tobacco use: Secondary | ICD-10-CM

## 2018-01-01 ENCOUNTER — Other Ambulatory Visit: Payer: BLUE CROSS/BLUE SHIELD

## 2018-01-27 DIAGNOSIS — N486 Induration penis plastica: Secondary | ICD-10-CM | POA: Diagnosis not present

## 2018-01-27 DIAGNOSIS — N5201 Erectile dysfunction due to arterial insufficiency: Secondary | ICD-10-CM | POA: Diagnosis not present

## 2018-01-27 DIAGNOSIS — N201 Calculus of ureter: Secondary | ICD-10-CM | POA: Diagnosis not present

## 2018-12-09 DIAGNOSIS — N183 Chronic kidney disease, stage 3 (moderate): Secondary | ICD-10-CM | POA: Diagnosis not present

## 2018-12-09 DIAGNOSIS — Z125 Encounter for screening for malignant neoplasm of prostate: Secondary | ICD-10-CM | POA: Diagnosis not present

## 2018-12-09 DIAGNOSIS — E7849 Other hyperlipidemia: Secondary | ICD-10-CM | POA: Diagnosis not present

## 2018-12-09 DIAGNOSIS — Z0000 Encounter for general adult medical examination without abnormal findings: Secondary | ICD-10-CM | POA: Diagnosis not present

## 2018-12-09 DIAGNOSIS — R82998 Other abnormal findings in urine: Secondary | ICD-10-CM | POA: Diagnosis not present

## 2018-12-15 DIAGNOSIS — Z1331 Encounter for screening for depression: Secondary | ICD-10-CM | POA: Diagnosis not present

## 2018-12-15 DIAGNOSIS — N183 Chronic kidney disease, stage 3 (moderate): Secondary | ICD-10-CM | POA: Diagnosis not present

## 2018-12-15 DIAGNOSIS — N486 Induration penis plastica: Secondary | ICD-10-CM | POA: Diagnosis not present

## 2018-12-15 DIAGNOSIS — Z0000 Encounter for general adult medical examination without abnormal findings: Secondary | ICD-10-CM | POA: Diagnosis not present

## 2018-12-15 DIAGNOSIS — R05 Cough: Secondary | ICD-10-CM | POA: Diagnosis not present

## 2018-12-15 DIAGNOSIS — R740 Nonspecific elevation of levels of transaminase and lactic acid dehydrogenase [LDH]: Secondary | ICD-10-CM | POA: Diagnosis not present

## 2018-12-19 DIAGNOSIS — Z1212 Encounter for screening for malignant neoplasm of rectum: Secondary | ICD-10-CM | POA: Diagnosis not present

## 2019-02-02 DIAGNOSIS — R2232 Localized swelling, mass and lump, left upper limb: Secondary | ICD-10-CM | POA: Diagnosis not present

## 2019-02-02 DIAGNOSIS — M65312 Trigger thumb, left thumb: Secondary | ICD-10-CM | POA: Diagnosis not present

## 2019-02-02 DIAGNOSIS — M79642 Pain in left hand: Secondary | ICD-10-CM | POA: Diagnosis not present

## 2019-02-23 DIAGNOSIS — Z716 Tobacco abuse counseling: Secondary | ICD-10-CM | POA: Diagnosis not present

## 2019-02-23 DIAGNOSIS — R5383 Other fatigue: Secondary | ICD-10-CM | POA: Diagnosis not present

## 2019-02-23 DIAGNOSIS — B9681 Helicobacter pylori [H. pylori] as the cause of diseases classified elsewhere: Secondary | ICD-10-CM | POA: Diagnosis not present

## 2019-02-23 DIAGNOSIS — K219 Gastro-esophageal reflux disease without esophagitis: Secondary | ICD-10-CM | POA: Diagnosis not present

## 2019-02-23 DIAGNOSIS — E7800 Pure hypercholesterolemia, unspecified: Secondary | ICD-10-CM | POA: Diagnosis not present

## 2019-02-23 DIAGNOSIS — E785 Hyperlipidemia, unspecified: Secondary | ICD-10-CM | POA: Diagnosis not present

## 2019-02-23 DIAGNOSIS — F17210 Nicotine dependence, cigarettes, uncomplicated: Secondary | ICD-10-CM | POA: Diagnosis not present

## 2019-02-23 DIAGNOSIS — G4730 Sleep apnea, unspecified: Secondary | ICD-10-CM | POA: Diagnosis not present

## 2019-03-02 DIAGNOSIS — M65312 Trigger thumb, left thumb: Secondary | ICD-10-CM | POA: Diagnosis not present

## 2019-03-02 DIAGNOSIS — M79642 Pain in left hand: Secondary | ICD-10-CM | POA: Diagnosis not present

## 2019-03-02 DIAGNOSIS — R2232 Localized swelling, mass and lump, left upper limb: Secondary | ICD-10-CM | POA: Diagnosis not present

## 2019-03-09 DIAGNOSIS — K2970 Gastritis, unspecified, without bleeding: Secondary | ICD-10-CM | POA: Diagnosis not present

## 2019-03-09 DIAGNOSIS — R768 Other specified abnormal immunological findings in serum: Secondary | ICD-10-CM | POA: Diagnosis not present

## 2019-04-09 DIAGNOSIS — K2970 Gastritis, unspecified, without bleeding: Secondary | ICD-10-CM | POA: Diagnosis not present

## 2019-04-09 DIAGNOSIS — B9681 Helicobacter pylori [H. pylori] as the cause of diseases classified elsewhere: Secondary | ICD-10-CM | POA: Diagnosis not present

## 2019-04-09 DIAGNOSIS — K219 Gastro-esophageal reflux disease without esophagitis: Secondary | ICD-10-CM | POA: Diagnosis not present

## 2019-04-09 DIAGNOSIS — F17210 Nicotine dependence, cigarettes, uncomplicated: Secondary | ICD-10-CM | POA: Diagnosis not present

## 2019-05-19 DIAGNOSIS — A048 Other specified bacterial intestinal infections: Secondary | ICD-10-CM | POA: Diagnosis not present

## 2019-05-19 DIAGNOSIS — K219 Gastro-esophageal reflux disease without esophagitis: Secondary | ICD-10-CM | POA: Diagnosis not present

## 2019-05-19 DIAGNOSIS — K2990 Gastroduodenitis, unspecified, without bleeding: Secondary | ICD-10-CM | POA: Diagnosis not present

## 2019-05-19 DIAGNOSIS — B9681 Helicobacter pylori [H. pylori] as the cause of diseases classified elsewhere: Secondary | ICD-10-CM | POA: Diagnosis not present

## 2019-05-19 DIAGNOSIS — K2970 Gastritis, unspecified, without bleeding: Secondary | ICD-10-CM | POA: Diagnosis not present

## 2019-06-15 DIAGNOSIS — Z20828 Contact with and (suspected) exposure to other viral communicable diseases: Secondary | ICD-10-CM | POA: Diagnosis not present

## 2019-09-08 DIAGNOSIS — K2970 Gastritis, unspecified, without bleeding: Secondary | ICD-10-CM | POA: Diagnosis not present

## 2019-09-08 DIAGNOSIS — K2990 Gastroduodenitis, unspecified, without bleeding: Secondary | ICD-10-CM | POA: Diagnosis not present

## 2019-09-08 DIAGNOSIS — B9681 Helicobacter pylori [H. pylori] as the cause of diseases classified elsewhere: Secondary | ICD-10-CM | POA: Diagnosis not present

## 2019-09-29 DIAGNOSIS — B9681 Helicobacter pylori [H. pylori] as the cause of diseases classified elsewhere: Secondary | ICD-10-CM | POA: Diagnosis not present

## 2019-09-29 DIAGNOSIS — K2900 Acute gastritis without bleeding: Secondary | ICD-10-CM | POA: Diagnosis not present

## 2019-09-29 DIAGNOSIS — F17210 Nicotine dependence, cigarettes, uncomplicated: Secondary | ICD-10-CM | POA: Diagnosis not present

## 2019-12-14 DIAGNOSIS — E7849 Other hyperlipidemia: Secondary | ICD-10-CM | POA: Diagnosis not present

## 2019-12-14 DIAGNOSIS — Z125 Encounter for screening for malignant neoplasm of prostate: Secondary | ICD-10-CM | POA: Diagnosis not present

## 2019-12-14 DIAGNOSIS — Z0000 Encounter for general adult medical examination without abnormal findings: Secondary | ICD-10-CM | POA: Diagnosis not present

## 2019-12-18 DIAGNOSIS — R82998 Other abnormal findings in urine: Secondary | ICD-10-CM | POA: Diagnosis not present

## 2019-12-20 DIAGNOSIS — Z1212 Encounter for screening for malignant neoplasm of rectum: Secondary | ICD-10-CM | POA: Diagnosis not present

## 2019-12-21 DIAGNOSIS — M545 Low back pain: Secondary | ICD-10-CM | POA: Diagnosis not present

## 2019-12-21 DIAGNOSIS — N486 Induration penis plastica: Secondary | ICD-10-CM | POA: Diagnosis not present

## 2019-12-21 DIAGNOSIS — R05 Cough: Secondary | ICD-10-CM | POA: Diagnosis not present

## 2019-12-21 DIAGNOSIS — Z0000 Encounter for general adult medical examination without abnormal findings: Secondary | ICD-10-CM | POA: Diagnosis not present

## 2019-12-21 DIAGNOSIS — R7401 Elevation of levels of liver transaminase levels: Secondary | ICD-10-CM | POA: Diagnosis not present

## 2019-12-30 ENCOUNTER — Other Ambulatory Visit: Payer: Self-pay | Admitting: Internal Medicine

## 2019-12-30 DIAGNOSIS — F17200 Nicotine dependence, unspecified, uncomplicated: Secondary | ICD-10-CM

## 2020-01-11 DIAGNOSIS — N486 Induration penis plastica: Secondary | ICD-10-CM | POA: Diagnosis not present

## 2020-01-11 DIAGNOSIS — Z125 Encounter for screening for malignant neoplasm of prostate: Secondary | ICD-10-CM | POA: Diagnosis not present

## 2020-01-11 DIAGNOSIS — N5201 Erectile dysfunction due to arterial insufficiency: Secondary | ICD-10-CM | POA: Diagnosis not present

## 2020-02-09 DIAGNOSIS — N5201 Erectile dysfunction due to arterial insufficiency: Secondary | ICD-10-CM | POA: Diagnosis not present

## 2020-02-09 DIAGNOSIS — N486 Induration penis plastica: Secondary | ICD-10-CM | POA: Diagnosis not present

## 2020-02-11 ENCOUNTER — Other Ambulatory Visit: Payer: Self-pay | Admitting: Urology

## 2020-02-21 DIAGNOSIS — Z20822 Contact with and (suspected) exposure to covid-19: Secondary | ICD-10-CM | POA: Diagnosis not present

## 2020-02-21 DIAGNOSIS — K529 Noninfective gastroenteritis and colitis, unspecified: Secondary | ICD-10-CM | POA: Diagnosis not present

## 2020-03-31 ENCOUNTER — Other Ambulatory Visit: Payer: Self-pay

## 2020-03-31 ENCOUNTER — Encounter (HOSPITAL_BASED_OUTPATIENT_CLINIC_OR_DEPARTMENT_OTHER): Payer: Self-pay | Admitting: Urology

## 2020-03-31 NOTE — Progress Notes (Addendum)
Spoke w/ via phone for pre-op interview---patient Lab needs dos----   none            COVID test ------04-05-2020@1205  pm Arrive at -------1215 pm 04-08-2020 No food after midnight, clear liquids from midnight until 815 an then npo Medications to take morning of surgery -----none Diabetic medication -----n/a Patient Special Instructions -----none Pre-Op special Istructions -----none Patient verbalized understanding of instructions that were given at this phone interview. Patient denies shortness of breath, chest pain, fever, cough a this phone interview.  osa does not use cpap

## 2020-04-05 ENCOUNTER — Other Ambulatory Visit (HOSPITAL_COMMUNITY)
Admission: RE | Admit: 2020-04-05 | Discharge: 2020-04-05 | Disposition: A | Discharge status: Home / Self Care | Payer: BC Managed Care – PPO | Source: Ambulatory Visit | Attending: Anesthesiology | Admitting: Anesthesiology

## 2020-04-05 DIAGNOSIS — J449 Chronic obstructive pulmonary disease, unspecified: Secondary | ICD-10-CM | POA: Diagnosis not present

## 2020-04-05 DIAGNOSIS — Z01812 Encounter for preprocedural laboratory examination: Secondary | ICD-10-CM | POA: Insufficient documentation

## 2020-04-05 DIAGNOSIS — F419 Anxiety disorder, unspecified: Secondary | ICD-10-CM | POA: Diagnosis not present

## 2020-04-05 DIAGNOSIS — Z833 Family history of diabetes mellitus: Secondary | ICD-10-CM | POA: Diagnosis not present

## 2020-04-05 DIAGNOSIS — F17210 Nicotine dependence, cigarettes, uncomplicated: Secondary | ICD-10-CM | POA: Diagnosis not present

## 2020-04-05 DIAGNOSIS — N486 Induration penis plastica: Secondary | ICD-10-CM | POA: Diagnosis not present

## 2020-04-05 DIAGNOSIS — K219 Gastro-esophageal reflux disease without esophagitis: Secondary | ICD-10-CM | POA: Diagnosis not present

## 2020-04-05 DIAGNOSIS — G8929 Other chronic pain: Secondary | ICD-10-CM | POA: Diagnosis not present

## 2020-04-05 DIAGNOSIS — E785 Hyperlipidemia, unspecified: Secondary | ICD-10-CM | POA: Diagnosis not present

## 2020-04-05 DIAGNOSIS — G4733 Obstructive sleep apnea (adult) (pediatric): Secondary | ICD-10-CM | POA: Diagnosis not present

## 2020-04-05 DIAGNOSIS — F17200 Nicotine dependence, unspecified, uncomplicated: Secondary | ICD-10-CM | POA: Diagnosis not present

## 2020-04-05 DIAGNOSIS — Z20822 Contact with and (suspected) exposure to covid-19: Secondary | ICD-10-CM | POA: Insufficient documentation

## 2020-04-05 DIAGNOSIS — M545 Low back pain: Secondary | ICD-10-CM | POA: Diagnosis not present

## 2020-04-05 LAB — SARS CORONAVIRUS 2 (TAT 6-24 HRS): SARS Coronavirus 2: NEGATIVE

## 2020-04-08 ENCOUNTER — Encounter (HOSPITAL_BASED_OUTPATIENT_CLINIC_OR_DEPARTMENT_OTHER)
Admission: RE | Disposition: A | Discharge status: Home / Self Care | Payer: Self-pay | Source: Ambulatory Visit | Attending: Urology

## 2020-04-08 ENCOUNTER — Ambulatory Visit (HOSPITAL_BASED_OUTPATIENT_CLINIC_OR_DEPARTMENT_OTHER)
Admission: RE | Admit: 2020-04-08 | Discharge: 2020-04-08 | Disposition: A | Discharge status: Home / Self Care | Payer: BC Managed Care – PPO | Source: Ambulatory Visit | Attending: Urology | Admitting: Urology

## 2020-04-08 ENCOUNTER — Ambulatory Visit (HOSPITAL_BASED_OUTPATIENT_CLINIC_OR_DEPARTMENT_OTHER): Payer: BC Managed Care – PPO | Admitting: Certified Registered"

## 2020-04-08 ENCOUNTER — Encounter (HOSPITAL_BASED_OUTPATIENT_CLINIC_OR_DEPARTMENT_OTHER): Payer: Self-pay | Admitting: Urology

## 2020-04-08 DIAGNOSIS — J449 Chronic obstructive pulmonary disease, unspecified: Secondary | ICD-10-CM | POA: Insufficient documentation

## 2020-04-08 DIAGNOSIS — G4733 Obstructive sleep apnea (adult) (pediatric): Secondary | ICD-10-CM | POA: Insufficient documentation

## 2020-04-08 DIAGNOSIS — K219 Gastro-esophageal reflux disease without esophagitis: Secondary | ICD-10-CM | POA: Insufficient documentation

## 2020-04-08 DIAGNOSIS — F17200 Nicotine dependence, unspecified, uncomplicated: Secondary | ICD-10-CM | POA: Diagnosis not present

## 2020-04-08 DIAGNOSIS — G8929 Other chronic pain: Secondary | ICD-10-CM | POA: Diagnosis not present

## 2020-04-08 DIAGNOSIS — M545 Low back pain: Secondary | ICD-10-CM | POA: Insufficient documentation

## 2020-04-08 DIAGNOSIS — F17210 Nicotine dependence, cigarettes, uncomplicated: Secondary | ICD-10-CM | POA: Diagnosis not present

## 2020-04-08 DIAGNOSIS — Z833 Family history of diabetes mellitus: Secondary | ICD-10-CM | POA: Insufficient documentation

## 2020-04-08 DIAGNOSIS — E785 Hyperlipidemia, unspecified: Secondary | ICD-10-CM | POA: Insufficient documentation

## 2020-04-08 DIAGNOSIS — J45909 Unspecified asthma, uncomplicated: Secondary | ICD-10-CM | POA: Diagnosis not present

## 2020-04-08 DIAGNOSIS — F419 Anxiety disorder, unspecified: Secondary | ICD-10-CM | POA: Insufficient documentation

## 2020-04-08 DIAGNOSIS — N486 Induration penis plastica: Secondary | ICD-10-CM | POA: Insufficient documentation

## 2020-04-08 HISTORY — PX: NESBIT PROCEDURE: SHX2087

## 2020-04-08 HISTORY — DX: Induration penis plastica: N48.6

## 2020-04-08 HISTORY — DX: Miliaria rubra: L74.0

## 2020-04-08 SURGERY — NESBIT PROCEDURE
Anesthesia: General

## 2020-04-08 MED ORDER — OXYCODONE HCL 5 MG PO TABS: 5.0000 mg | ORAL_TABLET | Freq: Once | ORAL | Status: DC | PRN

## 2020-04-08 MED ORDER — ONDANSETRON HCL 4 MG/2ML IJ SOLN: 4.0000 mg | Freq: Once | INTRAMUSCULAR | Status: DC | PRN

## 2020-04-08 MED ORDER — LIDOCAINE 2% (20 MG/ML) 5 ML SYRINGE
INTRAMUSCULAR | Status: DC | PRN
  Administered 2020-04-08: 100 mg via INTRAVENOUS

## 2020-04-08 MED ORDER — OXYCODONE HCL 5 MG/5ML PO SOLN: 5.0000 mg | Freq: Once | ORAL | Status: DC | PRN

## 2020-04-08 MED ORDER — ACETAMINOPHEN 325 MG PO TABS: 325.0000 mg | ORAL_TABLET | ORAL | Status: DC | PRN

## 2020-04-08 MED ORDER — CEFAZOLIN SODIUM-DEXTROSE 2-4 GM/100ML-% IV SOLN
INTRAVENOUS | Status: AC
  Filled 2020-04-08: qty 100

## 2020-04-08 MED ORDER — MEPERIDINE HCL 25 MG/ML IJ SOLN: 6.2500 mg | INTRAMUSCULAR | Status: DC | PRN

## 2020-04-08 MED ORDER — FENTANYL CITRATE (PF) 100 MCG/2ML IJ SOLN: 25.0000 ug | INTRAMUSCULAR | Status: DC | PRN

## 2020-04-08 MED ORDER — CEFAZOLIN SODIUM-DEXTROSE 2-4 GM/100ML-% IV SOLN
2.0000 g | INTRAVENOUS | Status: AC
  Administered 2020-04-08: 2 g via INTRAVENOUS

## 2020-04-08 MED ORDER — MIDAZOLAM HCL 2 MG/2ML IJ SOLN
INTRAMUSCULAR | Status: AC
  Filled 2020-04-08: qty 2

## 2020-04-08 MED ORDER — SENNOSIDES-DOCUSATE SODIUM 8.6-50 MG PO TABS
1.0000 | ORAL_TABLET | Freq: Two times a day (BID) | ORAL | 0 refills | Status: DC
Start: 2020-04-08 — End: 2022-07-23

## 2020-04-08 MED ORDER — ONDANSETRON HCL 4 MG/2ML IJ SOLN
INTRAMUSCULAR | Status: DC | PRN
  Administered 2020-04-08: 4 mg via INTRAVENOUS

## 2020-04-08 MED ORDER — HYDROCODONE-ACETAMINOPHEN 5-325 MG PO TABS: 1.0000 | ORAL_TABLET | Freq: Four times a day (QID) | ORAL | 0 refills | Status: DC | PRN

## 2020-04-08 MED ORDER — FENTANYL CITRATE (PF) 250 MCG/5ML IJ SOLN
INTRAMUSCULAR | Status: DC | PRN
  Administered 2020-04-08: 100 ug via INTRAVENOUS

## 2020-04-08 MED ORDER — BUPIVACAINE HCL (PF) 0.25 % IJ SOLN
INTRAMUSCULAR | Status: DC | PRN
  Administered 2020-04-08: 20 mL

## 2020-04-08 MED ORDER — FENTANYL CITRATE (PF) 100 MCG/2ML IJ SOLN
INTRAMUSCULAR | Status: AC
  Filled 2020-04-08: qty 2

## 2020-04-08 MED ORDER — KETOROLAC TROMETHAMINE 30 MG/ML IJ SOLN: 30.0000 mg | Freq: Once | INTRAMUSCULAR | Status: DC | PRN

## 2020-04-08 MED ORDER — DEXAMETHASONE SODIUM PHOSPHATE 10 MG/ML IJ SOLN
INTRAMUSCULAR | Status: DC | PRN
  Administered 2020-04-08: 4 mg via INTRAVENOUS

## 2020-04-08 MED ORDER — MIDAZOLAM HCL 5 MG/5ML IJ SOLN
INTRAMUSCULAR | Status: DC | PRN
  Administered 2020-04-08: 2 mg via INTRAVENOUS

## 2020-04-08 MED ORDER — LACTATED RINGERS IV SOLN: INTRAVENOUS | Status: DC

## 2020-04-08 MED ORDER — DEXAMETHASONE SODIUM PHOSPHATE 10 MG/ML IJ SOLN
INTRAMUSCULAR | Status: AC
  Filled 2020-04-08: qty 1

## 2020-04-08 MED ORDER — PROPOFOL 10 MG/ML IV BOLUS
INTRAVENOUS | Status: DC | PRN
  Administered 2020-04-08: 150 mg via INTRAVENOUS

## 2020-04-08 MED ORDER — ONDANSETRON HCL 4 MG/2ML IJ SOLN
INTRAMUSCULAR | Status: AC
  Filled 2020-04-08: qty 2

## 2020-04-08 MED ORDER — ACETAMINOPHEN 160 MG/5ML PO SOLN: 325.0000 mg | ORAL | Status: DC | PRN

## 2020-04-08 SURGICAL SUPPLY — 42 items
BLADE CLIPPER SENSICLIP SURGIC (BLADE) ×1 IMPLANT
BLADE HEX COATED 2.75 (ELECTRODE) ×2 IMPLANT
BLADE SURG 15 STRL LF DISP TIS (BLADE) ×1 IMPLANT
BLADE SURG 15 STRL SS (BLADE) ×2
BNDG COHESIVE 1X5 TAN NS LF (GAUZE/BANDAGES/DRESSINGS) ×1 IMPLANT
BNDG CONFORM 2 STRL LF (GAUZE/BANDAGES/DRESSINGS) ×1 IMPLANT
CANISTER SUCT 1200ML W/VALVE (MISCELLANEOUS) ×1 IMPLANT
CATH FOLEY 2WAY SLVR  5CC 16FR (CATHETERS) ×2
CATH FOLEY 2WAY SLVR 5CC 16FR (CATHETERS) ×1 IMPLANT
CATH ROBINSON RED A/P 10FR (CATHETERS) ×2 IMPLANT
COVER BACK TABLE 60X90IN (DRAPES) ×1 IMPLANT
COVER WAND RF STERILE (DRAPES) ×2 IMPLANT
DRAIN PENROSE 0.25X18 (DRAIN) ×2 IMPLANT
DRAPE LAPAROTOMY 100X72 PEDS (DRAPES) ×2 IMPLANT
ELECT REM PT RETURN 9FT ADLT (ELECTROSURGICAL) ×2
ELECTRODE REM PT RTRN 9FT ADLT (ELECTROSURGICAL) ×1 IMPLANT
GAUZE SPONGE 4X4 12PLY STRL (GAUZE/BANDAGES/DRESSINGS) ×1 IMPLANT
GLOVE BIO SURGEON STRL SZ 6.5 (GLOVE) ×1 IMPLANT
GLOVE BIO SURGEON STRL SZ7.5 (GLOVE) ×2 IMPLANT
GLOVE BIOGEL PI IND STRL 6.5 (GLOVE) IMPLANT
GLOVE BIOGEL PI IND STRL 7.0 (GLOVE) IMPLANT
GLOVE BIOGEL PI INDICATOR 6.5 (GLOVE) ×3
GLOVE BIOGEL PI INDICATOR 7.0 (GLOVE) ×1
GLOVE ECLIPSE 6.5 STRL STRAW (GLOVE) ×2 IMPLANT
GOWN STRL REUS W/TWL LRG LVL3 (GOWN DISPOSABLE) ×5 IMPLANT
IV NS 250ML (IV SOLUTION) ×2
IV NS 250ML BAXH (IV SOLUTION) ×1 IMPLANT
KIT TURNOVER CYSTO (KITS) ×2 IMPLANT
NEEDLE HYPO 22GX1.5 SAFETY (NEEDLE) ×1 IMPLANT
NS IRRIG 500ML POUR BTL (IV SOLUTION) ×2 IMPLANT
PENCIL BUTTON HOLSTER BLD 10FT (ELECTRODE) ×2 IMPLANT
PLUG CATH AND CAP STER (CATHETERS) ×2 IMPLANT
SET BASIN DAY SURGERY F.S. (CUSTOM PROCEDURE TRAY) ×2 IMPLANT
SET COLLECT BLD 21X.75 12 PB G (NEEDLE) IMPLANT
SUT ETHIBOND 2 OS 4 DA (SUTURE) ×4 IMPLANT
SUT VIC AB 3-0 SH 27 (SUTURE) ×4
SUT VIC AB 3-0 SH 27X BRD (SUTURE) IMPLANT
SYR 10ML LL (SYRINGE) ×4 IMPLANT
SYR 50ML LL SCALE MARK (SYRINGE) ×2 IMPLANT
SYR CONTROL 10ML LL (SYRINGE) ×2 IMPLANT
TOWEL OR 17X26 10 PK STRL BLUE (TOWEL DISPOSABLE) ×4 IMPLANT
WATER STERILE IRR 500ML POUR (IV SOLUTION) ×1 IMPLANT

## 2020-04-08 NOTE — Anesthesia Preprocedure Evaluation (Signed)
Anesthesia Evaluation  Patient identified by MRN, date of birth, ID band Patient awake    Reviewed: Allergy & Precautions, NPO status , Patient's Chart, lab work & pertinent test results  Airway Mallampati: I       Dental no notable dental hx. (+) Teeth Intact   Pulmonary Current Smoker and Patient abstained from smoking.,    Pulmonary exam normal breath sounds clear to auscultation       Cardiovascular negative cardio ROS Normal cardiovascular exam Rhythm:Regular Rate:Normal     Neuro/Psych Anxiety negative neurological ROS     GI/Hepatic Neg liver ROS, GERD  Medicated and Controlled,  Endo/Other  negative endocrine ROS  Renal/GU negative Renal ROS     Musculoskeletal   Abdominal Normal abdominal exam  (+)   Peds  Hematology negative hematology ROS (+)   Anesthesia Other Findings   Reproductive/Obstetrics                             Anesthesia Physical Anesthesia Plan  ASA: II  Anesthesia Plan: General   Post-op Pain Management:    Induction: Intravenous  PONV Risk Score and Plan: 2 and Ondansetron, Dexamethasone and Midazolam  Airway Management Planned: LMA  Additional Equipment: None  Intra-op Plan:   Post-operative Plan: Extubation in OR  Informed Consent: I have reviewed the patients History and Physical, chart, labs and discussed the procedure including the risks, benefits and alternatives for the proposed anesthesia with the patient or authorized representative who has indicated his/her understanding and acceptance.     Dental advisory given  Plan Discussed with: CRNA  Anesthesia Plan Comments:         Anesthesia Quick Evaluation

## 2020-04-08 NOTE — Transfer of Care (Signed)
Immediate Anesthesia Transfer of Care Note  Patient: Garrett Park  Procedure(s) Performed: NESBIT PROCEDURE, PENILE BLOCK (N/A )  Patient Location: PACU  Anesthesia Type:General  Level of Consciousness: awake, alert , oriented and patient cooperative  Airway & Oxygen Therapy: Patient Spontanous Breathing and Patient connected to face mask oxygen  Post-op Assessment: Report given to RN, Post -op Vital signs reviewed and stable and Patient moving all extremities  Post vital signs: Reviewed and stable  Last Vitals:  Vitals Value Taken Time  BP 140/80 04/08/20 1400  Temp    Pulse 76 04/08/20 1401  Resp 17 04/08/20 1401  SpO2 100 % 04/08/20 1401  Vitals shown include unvalidated device data.  Last Pain:  Vitals:   04/08/20 1238  TempSrc: Oral  PainSc: 0-No pain      Patients Stated Pain Goal: 6 (04/08/20 1238)  Complications: No complications documented.

## 2020-04-08 NOTE — Anesthesia Procedure Notes (Signed)
Procedure Name: LMA Insertion Date/Time: 04/08/2020 1:14 PM Performed by: Lucinda Dell, CRNA Pre-anesthesia Checklist: Patient identified, Emergency Drugs available, Suction available and Patient being monitored Patient Re-evaluated:Patient Re-evaluated prior to induction Oxygen Delivery Method: Circle system utilized Preoxygenation: Pre-oxygenation with 100% oxygen Induction Type: IV induction Ventilation: Mask ventilation without difficulty LMA: LMA inserted LMA Size: 4.0 Tube type: Oral Number of attempts: 1 Placement Confirmation: positive ETCO2 and breath sounds checked- equal and bilateral Tube secured with: Tape Dental Injury: Teeth and Oropharynx as per pre-operative assessment

## 2020-04-08 NOTE — Discharge Instructions (Signed)
1 - Remove penile dressing tomorrow. You may shower tomorrow. All skin stitches are dissolvable and will go away in about 3 weeks. No sexual stimulation x 3 weeks.   2 - Call MD or go to ER for fever >102, severe pain / nausea / vomiting not relieved by medications, or acute change in medical status.__________  [facility] [phone] [address] [place], [place] [address] Tel: [phone]     Call your surgeon if you experience:   1.  Fever over 101.0. 2.  Inability to urinate. 3.  Nausea and/or vomiting. 4.  Extreme swelling or bruising at the surgical site. 5.  Continued bleeding from the incision. 6.  Increased pain, redness or drainage from the incision. 7.  Problems related to your pain medication. 8. Any change in color, movement and/or sensation 9. Any problems and/or concerns   Post Anesthesia Home Care Instructions  Activity: Get plenty of rest for the remainder of the day. A responsible individual must stay with you for 24 hours following the procedure.  For the next 24 hours, DO NOT: -Drive a car -Advertising copywriter -Drink alcoholic beverages -Take any medication unless instructed by your physician -Make any legal decisions or sign important papers.  Meals: Start with liquid foods such as gelatin or soup. Progress to regular foods as tolerated. Avoid greasy, spicy, heavy foods. If nausea and/or vomiting occur, drink only clear liquids until the nausea and/or vomiting subsides. Call your physician if vomiting continues.  Special Instructions/Symptoms: Your throat may feel dry or sore from the anesthesia or the breathing tube placed in your throat during surgery. If this causes discomfort, gargle with warm salt water. The discomfort should disappear within 24 hours.  If you had a scopolamine patch placed behind your ear for the management of post- operative nausea and/or vomiting:  1. The medication in the patch is effective for 72 hours, after which it  should be removed.  Wrap patch in a tissue and discard in the trash. Wash hands thoroughly with soap and water. 2. You may remove the patch earlier than 72 hours if you experience unpleasant side effects which may include dry mouth, dizziness or visual disturbances. 3. Avoid touching the patch. Wash your hands with soap and water after contact with the patch.

## 2020-04-08 NOTE — Anesthesia Postprocedure Evaluation (Addendum)
Anesthesia Post Note  Patient: Garrett Park  Procedure(s) Performed: NESBIT PROCEDURE, PENILE BLOCK (N/A )     Patient location during evaluation: PACU Anesthesia Type: General Level of consciousness: awake Pain management: pain level controlled Vital Signs Assessment: post-procedure vital signs reviewed and stable Respiratory status: spontaneous breathing Cardiovascular status: stable Postop Assessment: no apparent nausea or vomiting Anesthetic complications: no   No complications documented.  Last Vitals:  Vitals:   04/08/20 1430 04/08/20 1438  BP: 128/83 136/82  Pulse: 60 60  Resp: (!) 21 17  Temp:    SpO2: 99% 98%    Last Pain:  Vitals:   04/08/20 1438  TempSrc:   PainSc: 0-No pain                 Caren Macadam

## 2020-04-08 NOTE — Brief Op Note (Signed)
04/08/2020  1:51 PM  PATIENT:  Garrett Park  54 y.o. male  PRE-OPERATIVE DIAGNOSIS:  PEYRONIES DISEASE  POST-OPERATIVE DIAGNOSIS:  PEYRONIES DISEASE  PROCEDURE:  Procedure(s) with comments: NESBIT PROCEDURE (N/A) - 1 HR  SURGEON:  Surgeon(s) and Role:    * Sebastian Ache, MD - Primary  PHYSICIAN ASSISTANT:   ASSISTANTS: none   ANESTHESIA:   local and general  EBL:  minimal   BLOOD ADMINISTERED:none  DRAINS: none   LOCAL MEDICATIONS USED:  LIDOCAINE   SPECIMEN:  No Specimen  DISPOSITION OF SPECIMEN:  N/A  COUNTS:  YES  TOURNIQUET:  * No tourniquets in log *  DICTATION: .Other Dictation: Dictation Number [id number]  PLAN OF CARE: Discharge to home after PACU  PATIENT DISPOSITION:  PACU - hemodynamically stable.   Delay start of Pharmacological VTE agent (>24hrs) due to surgical blood loss or risk of bleeding: yes

## 2020-04-08 NOTE — H&P (Signed)
Garrett Park is an 54 y.o. male.    Chief Complaint: Pre-Op Penile Plication  HPI:   1 - Peyronie's Disease - PT with about 20 degree ventral and slight rotational penile curvature for about 12 mos. NO h/o trauma. NO duptrynes. He does have mild problem with erections. 12/2019 he feels stable, but no photos to document severeity. 01/2020 pictures document stable ventral curvature at about 20 degrees but good fullness.   PMH sig for COPD (not limitig), Hip surgery. NO ischemic CV disease / blood thinners. 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 His PCP is Garrett Quale MD   Today " Garrett Park " is seen to proceed with penile plication for Peyronie's. C19 screen negative. NO interval fevers. .    Past Medical History:  Diagnosis Date  . Anxiety yrs ago  . Asthma   . Chronic low back pain   . Dyslipidemia   . GERD (gastroesophageal reflux disease)   . Heat rash    both arms and chest  . Neck pain   . OSA (obstructive sleep apnea)    intolerant of cpap  . OSA and COPD overlap syndrome (HCC) 01/09/2016   uses online detal device occ  . Peyronie disease     Past Surgical History:  Procedure Laterality Date  . COLONOSCOPY    . ESOPHAGOGASTRODUODENOSCOPY    . HIP PINNING,CANNULATED Right 01/30/2017   Procedure: CANNULATED HIP PINNING;  Surgeon: Deeann Saint, MD;  Location: [facility] ORS;  Service: Orthopedics;  Laterality: Right;    Family History  Problem Relation Age of Onset  . Diabetes Mellitus II Father    Social History:  reports that he has been smoking cigarettes. He has a 40.00 pack-year smoking history. He has never used smokeless tobacco. He reports current alcohol use. He reports that he does not use drugs.  Allergies: No Known Allergies  No medications prior to admission.    No results found for this or any previous visit (from the past 48 hour(s)). No results found.  Review of Systems  Constitutional: Negative for chills and fever.  All other systems  reviewed and are negative.   Height 5\' 11"  (1.803 m), weight 86.2 kg. Physical Exam  HENT:  Head: Normocephalic.  Nose: Nose normal.  Eyes: Pupils are equal, round, and reactive to light.  Cardiovascular: Normal rate and normal pulses.  Respiratory: Effort normal.  Genitourinary:    Genitourinary Comments: No CVAT   Musculoskeletal:        General: Normal range of motion.  Neurological: He is alert.  Skin: Skin is warm.  Psychiatric: Mood normal.     Assessment/Plan  Proceed as planned with penile plication. Risks, benefits, alternatives, expected peri-op course discussed perviously and reiterated today.   , MD 04/08/2020, 8:23 AM

## 2020-04-09 NOTE — Op Note (Signed)
NAMEZIAIR, PENSON MEDICAL RECORD [id number] ACCOUNT [id number] DATE OF BIRTH:Oct 17, 1966 FACILITY: [facility] LOCATION: [facility] PHYSICIAN:Cicilia Clinger Berneice Heinrich, MD  OPERATIVE REPORT  DATE OF PROCEDURE:  04/08/2020  SURGEON:  Sebastian Ache, MD  PREOPERATIVE DIAGNOSIS:  Peyronie disease with ventral curvature.  POSTOPERATIVE DIAGNOSIS:  Peyronie disease with ventral curvature.  PROCEDURE: 1.  Penile block. 2.  Penile plication.  ESTIMATED BLOOD LOSS:  Nil.  MEDICATIONS:  None.  SPECIMENS:  None.  FINDINGS:   1.  Approximately 20 degrees of ventral curvature pre-plication. 2.  Resolution of ventral curvature post-plication with 2 dorsal midline sutures.  INDICATIONS:  The patient is a pleasant 54 year old man with history of a penile curvature consistent with Peyronie disease.  His overall curvature has been stable for greater than 6 months.  It is ventral, approximately 20 degrees on pictures having  been obtained of erect penis.  Options were discussed for management, including observation versus oral medications versus injectable medications versus surgery with plication versus prosthetics, and given his fairly good baseline erectile function, he  wished to proceed with plication.  Informed consent was obtained and placed in the medical record.  DESCRIPTION OF PROCEDURE:  The patient being identified, the procedure being penile plication penile block was confirmed.  Procedure timeout was performed.  Intravenous antibiotics administered.  General anesthesia induced.  The patient was placed into a  supine position.  Sterile field was created, prepping and draping his penis, perineum and proximal thighs using iodine after clipper shaving.  A circumcising incision was made along his previous circumcision scar in the distal one-fourth of the penis  down to the fascia.  The penis was degloved.  A tourniquet of 1/4-inch Penrose was applied at the base of the penis and a 21-gauge butterfly  needle was used to inject the lateral corpora with injectable saline.  With artificial erection, did corroborate  approximately 20 degrees of ventral curvature.  There was no rotational anomaly appreciated.  The point of maximum angulation was noted and a plication suture was placed in the midline to avoid injury to the penile nerve in a cloverleaf-type fashion.   This was tightened so that the knot was buried and artificial erection was again achieved.  This revealed approximately 60% reduction in the level of ventral curvature.  It was felt that 1 additional plication stitch would be warranted.  As such, just  distal to this,  Another cloverleaf, an Ethibond suture was placed and final artificial erection corroborated complete resolution of ventral angulation.  The butterfly needle was removed and a figure-of-eight stitch of Vicryl was placed over the location.   The tourniquet was released.  Penile skin was reapproximated at the 6 o'clock and 12 o'clock positions with interrupted suture and then 2 separate running suture lines circumferentially which revealed an excellent reapproximation of the penile skin.   Attention was directed at penile block.  Ten mL of 0.25% lidocaine was infiltrated along the presumed course of the dorsal penile nerve just below the pubic ramus, with an additional 10 mL in a ring block type fashion at the base of the penis.  A dressing of Kling and Coban was loosely applied  such that it was nonconstrictive and the procedure was terminated.  The patient tolerated the procedure well.  No immediate perioperative complications.  The patient was taken to postanesthesia care unit in stable condition.  Plan for discharge home.  VN/NUANCE  D:04/08/2020 T:04/08/2020 JOB:[id number]

## 2020-04-11 ENCOUNTER — Encounter (HOSPITAL_BASED_OUTPATIENT_CLINIC_OR_DEPARTMENT_OTHER): Payer: Self-pay | Admitting: Urology

## 2020-04-26 DIAGNOSIS — N5201 Erectile dysfunction due to arterial insufficiency: Secondary | ICD-10-CM | POA: Diagnosis not present

## 2020-04-26 DIAGNOSIS — N201 Calculus of ureter: Secondary | ICD-10-CM | POA: Diagnosis not present

## 2020-04-26 DIAGNOSIS — N486 Induration penis plastica: Secondary | ICD-10-CM | POA: Diagnosis not present

## 2020-09-17 DIAGNOSIS — Z20822 Contact with and (suspected) exposure to covid-19: Secondary | ICD-10-CM | POA: Diagnosis not present

## 2020-09-17 DIAGNOSIS — R509 Fever, unspecified: Secondary | ICD-10-CM | POA: Diagnosis not present

## 2020-11-07 DIAGNOSIS — Z112 Encounter for screening for other bacterial diseases: Secondary | ICD-10-CM | POA: Diagnosis not present

## 2020-11-07 DIAGNOSIS — Z1159 Encounter for screening for other viral diseases: Secondary | ICD-10-CM | POA: Diagnosis not present

## 2020-11-07 DIAGNOSIS — Z1152 Encounter for screening for COVID-19: Secondary | ICD-10-CM | POA: Diagnosis not present

## 2020-12-05 DIAGNOSIS — L989 Disorder of the skin and subcutaneous tissue, unspecified: Secondary | ICD-10-CM | POA: Diagnosis not present

## 2020-12-05 DIAGNOSIS — L700 Acne vulgaris: Secondary | ICD-10-CM | POA: Diagnosis not present

## 2020-12-05 DIAGNOSIS — D485 Neoplasm of uncertain behavior of skin: Secondary | ICD-10-CM | POA: Diagnosis not present

## 2020-12-16 DIAGNOSIS — M329 Systemic lupus erythematosus, unspecified: Secondary | ICD-10-CM | POA: Diagnosis not present

## 2020-12-19 DIAGNOSIS — Z125 Encounter for screening for malignant neoplasm of prostate: Secondary | ICD-10-CM | POA: Diagnosis not present

## 2020-12-19 DIAGNOSIS — E785 Hyperlipidemia, unspecified: Secondary | ICD-10-CM | POA: Diagnosis not present

## 2020-12-26 DIAGNOSIS — M542 Cervicalgia: Secondary | ICD-10-CM | POA: Diagnosis not present

## 2020-12-26 DIAGNOSIS — R82998 Other abnormal findings in urine: Secondary | ICD-10-CM | POA: Diagnosis not present

## 2020-12-26 DIAGNOSIS — Z0000 Encounter for general adult medical examination without abnormal findings: Secondary | ICD-10-CM | POA: Diagnosis not present

## 2020-12-26 DIAGNOSIS — Z1212 Encounter for screening for malignant neoplasm of rectum: Secondary | ICD-10-CM | POA: Diagnosis not present

## 2020-12-28 ENCOUNTER — Other Ambulatory Visit: Payer: Self-pay | Admitting: Internal Medicine

## 2020-12-28 DIAGNOSIS — F17200 Nicotine dependence, unspecified, uncomplicated: Secondary | ICD-10-CM

## 2021-02-09 DIAGNOSIS — G4733 Obstructive sleep apnea (adult) (pediatric): Secondary | ICD-10-CM | POA: Diagnosis not present

## 2021-02-14 DIAGNOSIS — G4733 Obstructive sleep apnea (adult) (pediatric): Secondary | ICD-10-CM | POA: Diagnosis not present

## 2021-03-01 DIAGNOSIS — G4733 Obstructive sleep apnea (adult) (pediatric): Secondary | ICD-10-CM | POA: Diagnosis not present

## 2021-04-11 DIAGNOSIS — L814 Other melanin hyperpigmentation: Secondary | ICD-10-CM | POA: Diagnosis not present

## 2021-04-11 DIAGNOSIS — D229 Melanocytic nevi, unspecified: Secondary | ICD-10-CM | POA: Diagnosis not present

## 2021-04-11 DIAGNOSIS — L309 Dermatitis, unspecified: Secondary | ICD-10-CM | POA: Diagnosis not present

## 2021-04-11 DIAGNOSIS — L821 Other seborrheic keratosis: Secondary | ICD-10-CM | POA: Diagnosis not present

## 2021-04-12 DIAGNOSIS — L309 Dermatitis, unspecified: Secondary | ICD-10-CM | POA: Diagnosis not present

## 2021-05-18 DIAGNOSIS — L308 Other specified dermatitis: Secondary | ICD-10-CM | POA: Diagnosis not present

## 2021-08-17 DIAGNOSIS — L309 Dermatitis, unspecified: Secondary | ICD-10-CM | POA: Diagnosis not present

## 2022-01-15 DIAGNOSIS — E785 Hyperlipidemia, unspecified: Secondary | ICD-10-CM | POA: Diagnosis not present

## 2022-01-15 DIAGNOSIS — Z125 Encounter for screening for malignant neoplasm of prostate: Secondary | ICD-10-CM | POA: Diagnosis not present

## 2022-01-22 DIAGNOSIS — Z1212 Encounter for screening for malignant neoplasm of rectum: Secondary | ICD-10-CM | POA: Diagnosis not present

## 2022-01-22 DIAGNOSIS — Z1339 Encounter for screening examination for other mental health and behavioral disorders: Secondary | ICD-10-CM | POA: Diagnosis not present

## 2022-01-22 DIAGNOSIS — Z0000 Encounter for general adult medical examination without abnormal findings: Secondary | ICD-10-CM | POA: Diagnosis not present

## 2022-01-22 DIAGNOSIS — R82998 Other abnormal findings in urine: Secondary | ICD-10-CM | POA: Diagnosis not present

## 2022-01-22 DIAGNOSIS — Z1331 Encounter for screening for depression: Secondary | ICD-10-CM | POA: Diagnosis not present

## 2022-01-22 DIAGNOSIS — M5136 Other intervertebral disc degeneration, lumbar region: Secondary | ICD-10-CM | POA: Diagnosis not present

## 2022-01-24 ENCOUNTER — Other Ambulatory Visit: Payer: Self-pay | Admitting: Internal Medicine

## 2022-01-24 DIAGNOSIS — F17200 Nicotine dependence, unspecified, uncomplicated: Secondary | ICD-10-CM

## 2022-02-15 ENCOUNTER — Ambulatory Visit
Admission: RE | Admit: 2022-02-15 | Discharge: 2022-02-15 | Disposition: A | Discharge status: Home / Self Care | Payer: BC Managed Care – PPO | Source: Ambulatory Visit | Attending: Internal Medicine | Admitting: Internal Medicine

## 2022-02-15 DIAGNOSIS — F17200 Nicotine dependence, unspecified, uncomplicated: Secondary | ICD-10-CM

## 2022-02-15 DIAGNOSIS — I700 Atherosclerosis of aorta: Secondary | ICD-10-CM | POA: Diagnosis not present

## 2022-02-15 DIAGNOSIS — I2510 Atherosclerotic heart disease of native coronary artery without angina pectoris: Secondary | ICD-10-CM | POA: Diagnosis not present

## 2022-02-15 DIAGNOSIS — J432 Centrilobular emphysema: Secondary | ICD-10-CM | POA: Diagnosis not present

## 2022-02-15 DIAGNOSIS — Z87891 Personal history of nicotine dependence: Secondary | ICD-10-CM | POA: Diagnosis not present

## 2022-04-04 DIAGNOSIS — L570 Actinic keratosis: Secondary | ICD-10-CM | POA: Diagnosis not present

## 2022-04-04 DIAGNOSIS — L239 Allergic contact dermatitis, unspecified cause: Secondary | ICD-10-CM | POA: Diagnosis not present

## 2022-04-04 DIAGNOSIS — D492 Neoplasm of unspecified behavior of bone, soft tissue, and skin: Secondary | ICD-10-CM | POA: Diagnosis not present

## 2022-05-28 DIAGNOSIS — R0609 Other forms of dyspnea: Secondary | ICD-10-CM | POA: Diagnosis not present

## 2022-05-28 DIAGNOSIS — R079 Chest pain, unspecified: Secondary | ICD-10-CM | POA: Diagnosis not present

## 2022-05-28 DIAGNOSIS — R9431 Abnormal electrocardiogram [ECG] [EKG]: Secondary | ICD-10-CM | POA: Diagnosis not present

## 2022-05-28 DIAGNOSIS — M25512 Pain in left shoulder: Secondary | ICD-10-CM | POA: Diagnosis not present

## 2022-05-28 DIAGNOSIS — I10 Essential (primary) hypertension: Secondary | ICD-10-CM | POA: Diagnosis not present

## 2022-05-28 DIAGNOSIS — I214 Non-ST elevation (NSTEMI) myocardial infarction: Secondary | ICD-10-CM | POA: Diagnosis not present

## 2022-05-28 DIAGNOSIS — K219 Gastro-esophageal reflux disease without esophagitis: Secondary | ICD-10-CM | POA: Diagnosis not present

## 2022-05-29 DIAGNOSIS — M549 Dorsalgia, unspecified: Secondary | ICD-10-CM | POA: Diagnosis not present

## 2022-05-29 DIAGNOSIS — R079 Chest pain, unspecified: Secondary | ICD-10-CM | POA: Diagnosis not present

## 2022-05-29 DIAGNOSIS — Z6831 Body mass index (BMI) 31.0-31.9, adult: Secondary | ICD-10-CM | POA: Diagnosis not present

## 2022-05-29 DIAGNOSIS — M329 Systemic lupus erythematosus, unspecified: Secondary | ICD-10-CM | POA: Diagnosis not present

## 2022-05-29 DIAGNOSIS — Z8249 Family history of ischemic heart disease and other diseases of the circulatory system: Secondary | ICD-10-CM | POA: Diagnosis not present

## 2022-05-29 DIAGNOSIS — Z951 Presence of aortocoronary bypass graft: Secondary | ICD-10-CM | POA: Diagnosis not present

## 2022-05-29 DIAGNOSIS — R9431 Abnormal electrocardiogram [ECG] [EKG]: Secondary | ICD-10-CM | POA: Diagnosis not present

## 2022-05-29 DIAGNOSIS — I10 Essential (primary) hypertension: Secondary | ICD-10-CM | POA: Diagnosis not present

## 2022-05-29 DIAGNOSIS — I4901 Ventricular fibrillation: Secondary | ICD-10-CM | POA: Diagnosis not present

## 2022-05-29 DIAGNOSIS — E669 Obesity, unspecified: Secondary | ICD-10-CM | POA: Diagnosis not present

## 2022-05-29 DIAGNOSIS — I2510 Atherosclerotic heart disease of native coronary artery without angina pectoris: Secondary | ICD-10-CM | POA: Diagnosis not present

## 2022-05-29 DIAGNOSIS — M25512 Pain in left shoulder: Secondary | ICD-10-CM | POA: Diagnosis not present

## 2022-05-29 DIAGNOSIS — J449 Chronic obstructive pulmonary disease, unspecified: Secondary | ICD-10-CM | POA: Diagnosis not present

## 2022-05-29 DIAGNOSIS — Z452 Encounter for adjustment and management of vascular access device: Secondary | ICD-10-CM | POA: Diagnosis not present

## 2022-05-29 DIAGNOSIS — G4733 Obstructive sleep apnea (adult) (pediatric): Secondary | ICD-10-CM | POA: Diagnosis not present

## 2022-05-29 DIAGNOSIS — I219 Acute myocardial infarction, unspecified: Secondary | ICD-10-CM | POA: Diagnosis not present

## 2022-05-29 DIAGNOSIS — K449 Diaphragmatic hernia without obstruction or gangrene: Secondary | ICD-10-CM | POA: Diagnosis not present

## 2022-05-29 DIAGNOSIS — E785 Hyperlipidemia, unspecified: Secondary | ICD-10-CM | POA: Diagnosis not present

## 2022-05-29 DIAGNOSIS — R0609 Other forms of dyspnea: Secondary | ICD-10-CM | POA: Diagnosis not present

## 2022-05-29 DIAGNOSIS — Z9889 Other specified postprocedural states: Secondary | ICD-10-CM | POA: Diagnosis not present

## 2022-05-29 DIAGNOSIS — I6523 Occlusion and stenosis of bilateral carotid arteries: Secondary | ICD-10-CM | POA: Diagnosis not present

## 2022-05-29 DIAGNOSIS — T7840XA Allergy, unspecified, initial encounter: Secondary | ICD-10-CM | POA: Diagnosis not present

## 2022-05-29 DIAGNOSIS — I25119 Atherosclerotic heart disease of native coronary artery with unspecified angina pectoris: Secondary | ICD-10-CM | POA: Diagnosis not present

## 2022-05-29 DIAGNOSIS — N179 Acute kidney failure, unspecified: Secondary | ICD-10-CM | POA: Diagnosis not present

## 2022-05-29 DIAGNOSIS — F17210 Nicotine dependence, cigarettes, uncomplicated: Secondary | ICD-10-CM | POA: Diagnosis not present

## 2022-05-29 DIAGNOSIS — Z01810 Encounter for preprocedural cardiovascular examination: Secondary | ICD-10-CM | POA: Diagnosis not present

## 2022-05-29 DIAGNOSIS — I214 Non-ST elevation (NSTEMI) myocardial infarction: Secondary | ICD-10-CM | POA: Diagnosis not present

## 2022-05-29 DIAGNOSIS — R7989 Other specified abnormal findings of blood chemistry: Secondary | ICD-10-CM | POA: Diagnosis not present

## 2022-05-29 DIAGNOSIS — K219 Gastro-esophageal reflux disease without esophagitis: Secondary | ICD-10-CM | POA: Diagnosis not present

## 2022-05-29 DIAGNOSIS — J9811 Atelectasis: Secondary | ICD-10-CM | POA: Diagnosis not present

## 2022-05-29 DIAGNOSIS — R918 Other nonspecific abnormal finding of lung field: Secondary | ICD-10-CM | POA: Diagnosis not present

## 2022-06-01 HISTORY — PX: CORONARY ARTERY BYPASS GRAFT: SHX141

## 2022-06-12 DIAGNOSIS — R051 Acute cough: Secondary | ICD-10-CM | POA: Diagnosis not present

## 2022-06-12 DIAGNOSIS — J45909 Unspecified asthma, uncomplicated: Secondary | ICD-10-CM | POA: Diagnosis not present

## 2022-06-12 DIAGNOSIS — I25810 Atherosclerosis of coronary artery bypass graft(s) without angina pectoris: Secondary | ICD-10-CM | POA: Diagnosis not present

## 2022-06-13 ENCOUNTER — Other Ambulatory Visit: Payer: Self-pay | Admitting: *Deleted

## 2022-06-13 DIAGNOSIS — Z951 Presence of aortocoronary bypass graft: Secondary | ICD-10-CM

## 2022-06-18 ENCOUNTER — Telehealth (HOSPITAL_COMMUNITY): Payer: Self-pay | Admitting: *Deleted

## 2022-06-18 NOTE — Telephone Encounter (Signed)
Received office visit note from this pt PCP at [facility].  Pt seen in follow up by Jarome Lamas NP on 8/15. Pt had  Nstemi on 7/31 and CABG x 1 on 06/01/22  while traveling in [place].  Called and spoke to pt wife who is listed on his DPR, pt has upcoming appt for cardiology to establish care on 8/22 with Dr. Gwen Pounds and on 8/28 with Dr. Morton Peters for CVTS.  Pt wife eager for her husband to start right away with CR.  Advised pt of our present wait time of 4-6 weeks which can vary.  Pt wife asked if he could go anywhere else.  Pt lives in [place] and they are about equal distance from [facility] and [facility].  Called [facility] wait times are about 2-3 weeks if all paperwork is present.  Pt wife would like for her husband information be sent to [facility].  Aware to ask for referral from Dr. Gwen Pounds for CR be sent to [facility] along with 12 lead EKG. Alanson Aly, BSN Cardiac and Emergency planning/management officer

## 2022-06-19 DIAGNOSIS — G4733 Obstructive sleep apnea (adult) (pediatric): Secondary | ICD-10-CM | POA: Diagnosis not present

## 2022-06-19 DIAGNOSIS — Z951 Presence of aortocoronary bypass graft: Secondary | ICD-10-CM | POA: Diagnosis not present

## 2022-06-19 DIAGNOSIS — I25708 Atherosclerosis of coronary artery bypass graft(s), unspecified, with other forms of angina pectoris: Secondary | ICD-10-CM | POA: Diagnosis not present

## 2022-06-19 DIAGNOSIS — F17210 Nicotine dependence, cigarettes, uncomplicated: Secondary | ICD-10-CM | POA: Diagnosis not present

## 2022-06-21 ENCOUNTER — Other Ambulatory Visit: Payer: Self-pay | Admitting: Cardiothoracic Surgery

## 2022-06-21 DIAGNOSIS — Z951 Presence of aortocoronary bypass graft: Secondary | ICD-10-CM

## 2022-06-25 ENCOUNTER — Encounter: Payer: BC Managed Care – PPO | Admitting: Cardiothoracic Surgery

## 2022-06-26 ENCOUNTER — Institutional Professional Consult (permissible substitution): Payer: BC Managed Care – PPO | Admitting: Cardiothoracic Surgery

## 2022-06-26 ENCOUNTER — Encounter: Payer: Self-pay | Admitting: *Deleted

## 2022-06-26 ENCOUNTER — Encounter: Payer: BC Managed Care – PPO | Attending: Internal Medicine | Admitting: *Deleted

## 2022-06-26 ENCOUNTER — Ambulatory Visit
Admission: RE | Admit: 2022-06-26 | Discharge: 2022-06-26 | Disposition: A | Discharge status: Home / Self Care | Payer: BC Managed Care – PPO | Source: Ambulatory Visit | Attending: Cardiothoracic Surgery | Admitting: Cardiothoracic Surgery

## 2022-06-26 ENCOUNTER — Encounter: Payer: Self-pay | Admitting: Cardiothoracic Surgery

## 2022-06-26 DIAGNOSIS — Z951 Presence of aortocoronary bypass graft: Secondary | ICD-10-CM

## 2022-06-26 DIAGNOSIS — J90 Pleural effusion, not elsewhere classified: Secondary | ICD-10-CM | POA: Diagnosis not present

## 2022-06-26 DIAGNOSIS — Z09 Encounter for follow-up examination after completed treatment for conditions other than malignant neoplasm: Secondary | ICD-10-CM

## 2022-06-26 DIAGNOSIS — I214 Non-ST elevation (NSTEMI) myocardial infarction: Secondary | ICD-10-CM

## 2022-06-26 DIAGNOSIS — Z9889 Other specified postprocedural states: Secondary | ICD-10-CM | POA: Diagnosis not present

## 2022-06-26 NOTE — Progress Notes (Signed)
Virtual orientation call completed today. he has an appointment on Date: 07/04/2022  for EP eval and gym Orientation.  Documentation of diagnosis can be found in [facility] Date: 05/29/2022 .  Tavius is a current tobacco user. Intervention for tobacco cessation was provided at the initial medical review. He was asked about readiness to quit and reported that he has decreased number of cigarettes since surgery. Was at 1 1/4 pack a day. Is currently at 5-7 cigarettes a day and is planning to decrease to zero. He does not have a set quit date . Patient was advised and educated about tobacco cessation using combination therapy, tobacco cessation classes, quit line, and quit smoking apps. Patient demonstrated understanding of this material. Staff will continue to provide encouragement and follow up with the patient throughout the program.

## 2022-06-26 NOTE — Progress Notes (Signed)
PCP is Lamar Blinks, MD Referring Provider is Garlan Fillers, MD  Chief Complaint  Patient presents with   New Patient (Initial Visit)    New patient consultation, s/p CABG in [place] with chest xray    HPI: The patient is a 56 year old nondiabetic smoker who presents for surgical follow-up after emergency CABG x1 left [facility] to [facility] in [place] 3 weeks ago on August 4.  He has done well.  He needs surgical follow-up following the procedure.  He has been seen in the cardiology clinic and has appointment to be seen by cardiology in the future.\ Patient has a strong cardiac family history. Patient was hospitalized 4 days after surgery and is currently on aspirin, Plavix, Lipitor, metoprolol, and pain medication.  He is taking the hydrocodone about twice a day.  He has smoked for several years and is trying to gradually wean off the cigarettes.  His weight is approximately 10 to 15 pounds lower than his normal weight. He is usually able to sleep without too much difficulty at night. Past Medical History:  Diagnosis Date   Anxiety yrs ago   Asthma    Chronic low back pain    Dyslipidemia    GERD (gastroesophageal reflux disease)    Heat rash    both arms and chest   Neck pain    OSA (obstructive sleep apnea)    intolerant of cpap   OSA and COPD overlap syndrome (HCC) 01/09/2016   uses online detal device occ   Peyronie disease     Past Surgical History:  Procedure Laterality Date   COLONOSCOPY     ESOPHAGOGASTRODUODENOSCOPY     HIP PINNING,CANNULATED Right 01/30/2017   Procedure: CANNULATED HIP PINNING;  Surgeon: Deeann Saint, MD;  Location: [facility] ORS;  Service: Orthopedics;  Laterality: Right;   NESBIT PROCEDURE N/A 04/08/2020   Procedure: NESBIT PROCEDURE, PENILE BLOCK;  Surgeon: Sebastian Ache, MD;  Location: [facility];  Service: Urology;  Laterality: N/A;  1 HR    Family History  Problem Relation Age of Onset   Diabetes Mellitus II Father     Social  History Social History   Tobacco Use   Smoking status: Every Day    Packs/day: 1.00    Years: 40.00    Total pack years: 40.00    Types: Cigarettes   Smokeless tobacco: Never   Tobacco comments:    IS working on quitting. Down to 5-7 cigarettes a day . 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   No quit date set.   Vaping Use   Vaping Use: Some days   Substances: Nicotine  Substance Use Topics   Alcohol use: Yes    Alcohol/week: 0.0 standard drinks of alcohol    Comment: occ   Drug use: No    Current Outpatient Medications  Medication Sig Dispense Refill   albuterol (VENTOLIN HFA) 108 (90 Base) MCG/ACT inhaler      ascorbic acid (VITAMIN C) 500 MG tablet Take by mouth.     aspirin EC 81 MG tablet Take by mouth.     atorvastatin (LIPITOR) 20 MG tablet Take by mouth.     clopidogrel (PLAVIX) 75 MG tablet Take by mouth.     fluticasone (FLONASE ALLERGY RELIEF) 50 MCG/ACT nasal spray Place into both nostrils daily as needed for allergies or rhinitis.     HYDROcodone-acetaminophen (NORCO/VICODIN) 5-325 MG tablet Take 1 tablet by mouth every 6 (six) hours as needed for moderate pain or severe  pain. Post-operatively. 15 tablet 0   melatonin 5 MG TABS 1 tablet in the evening Orally Once a day     metoprolol tartrate (LOPRESSOR) 25 MG tablet Take by mouth.     pantoprazole (PROTONIX) 40 MG tablet Take by mouth.     senna-docusate (SENOKOT-S) 8.6-50 MG tablet Take 1 tablet by mouth 2 (two) times daily. While taking strong pain meds to prevent constipation. 10 tablet 0   triprolidine-pseudoephedrine (APRODINE) 2.5-60 MG TABS tablet Take 1 tablet by mouth every 6 (six) hours as needed for allergies. actifed     No current facility-administered medications for this visit.    No Known Allergies  Review of Systems Weight down from lack of appetite after surgery No recurrent angina No short of breath Surgical incisions healing well.  BP 103/70 (BP Location: Left Arm, Patient Position:  Sitting, Cuff Size: Normal)   Pulse 79   Resp 20   Ht 5\' 11"  (1.803 m)   Wt 182 lb 4.8 oz (82.7 kg)   SpO2 99% Comment: RA  BMI 25.43 kg/m  Physical Exam      Exam    General- alert and comfortable Sternal incision well-healed.    Neck- no JVD, no cervical adenopathy palpable, no carotid bruit   Lungs- clear without rales, wheezes   Cor- regular rate and rhythm, no murmur , gallop   Abdomen- soft, non-tender   Extremities - warm, non-tender, minimal edema   Neuro- oriented, appropriate, no focal weakness   Diagnostic Tests: Chest x-ray PA and lateral today shows clear lung fields stable cardiac silhouette sternal wires intact  Impression: Patient doing well 3 weeks postop urgent CABG x1. He cannot lift 5 pounds until mid September. He can start driving right after Labor Day. 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 He should continue his discharge medications.  He knows he should stop smoking.  Plan: Return in 4 weeks for review of progress.   November, MD [facility] [phone]

## 2022-06-27 ENCOUNTER — Other Ambulatory Visit: Payer: Self-pay | Admitting: Physician Assistant

## 2022-06-27 ENCOUNTER — Telehealth: Payer: Self-pay | Admitting: *Deleted

## 2022-06-27 MED ORDER — OXYCODONE HCL 5 MG PO TABS: 5.0000 mg | ORAL_TABLET | Freq: Four times a day (QID) | ORAL | 0 refills | Status: DC | PRN

## 2022-06-27 NOTE — Progress Notes (Signed)
RX refill provided

## 2022-06-27 NOTE — Telephone Encounter (Signed)
Patient's wife contacted the office requesting a refill of Vicodin for patient. Patient is s/p CABG 8/4 in [place]. Per Jaclyn Prime, PA, prescription sent to patient's listed pharmacy of [facility]. Boyd Kerbs made aware of refill and for patient to begin weaning himself off narcotic pain medication.

## 2022-07-04 ENCOUNTER — Encounter: Payer: BC Managed Care – PPO | Attending: Internal Medicine

## 2022-07-04 VITALS — Ht 71.5 in | Wt 183.9 lb

## 2022-07-04 DIAGNOSIS — I214 Non-ST elevation (NSTEMI) myocardial infarction: Secondary | ICD-10-CM | POA: Diagnosis not present

## 2022-07-04 DIAGNOSIS — Z48812 Encounter for surgical aftercare following surgery on the circulatory system: Secondary | ICD-10-CM | POA: Insufficient documentation

## 2022-07-04 DIAGNOSIS — Z951 Presence of aortocoronary bypass graft: Secondary | ICD-10-CM | POA: Diagnosis not present

## 2022-07-04 DIAGNOSIS — I252 Old myocardial infarction: Secondary | ICD-10-CM | POA: Insufficient documentation

## 2022-07-04 NOTE — Patient Instructions (Signed)
Patient Instructions  Patient Details  Name: Garrett Park MRN: [id number] Date of Birth: 10-05-1966 Referring Provider:  Corey Skains, MD  Below are your personal goals for exercise, nutrition, and risk factors. Our goal is to help you stay on track towards obtaining and maintaining these goals. We will be discussing your progress on these goals with you throughout the program.  Initial Exercise Prescription:  Initial Exercise Prescription - 07/04/22 1200       Date of Initial Exercise RX and Referring Provider   Date 07/04/22    Referring Provider Garrett Royals MD      Oxygen   Maintain Oxygen Saturation 88% or higher      Treadmill   MPH 2.2    Grade 1    Minutes 15    METs 2.99      Recumbant Bike   Level 3    RPM 60    Watts 30    Minutes 15    METs 3.3      REL-XR   Level 2    Speed 50    Minutes 15    METs 3.3      T5 Nustep   Level 2    SPM 80    Minutes 15    METs 3.3      Prescription Details   Frequency (times per week) 3    Duration Progress to 30 minutes of continuous aerobic without signs/symptoms of physical distress      Intensity   THRR 40-80% of Max Heartrate 109 - 145    Ratings of Perceived Exertion 11-13    Perceived Dyspnea 0-4      Progression   Progression Continue to progress workloads to maintain intensity without signs/symptoms of physical distress.      Resistance Training   Training Prescription Yes    Weight 3 lb    Reps 10-15             Exercise Goals: Frequency: Be able to perform aerobic exercise two to three times per week in program working toward 2-5 days per week of home exercise.  Intensity: Work with a perceived exertion of 11 (fairly light) - 15 (hard) while following your exercise prescription.  We will make changes to your prescription with you as you progress through the program.   Duration: Be able to do 30 to 45 minutes of continuous aerobic exercise in addition to a 5 minute warm-up and a 5  minute cool-down routine.   Nutrition Goals: Your personal nutrition goals will be established when you do your nutrition analysis with the dietician.  The following are general nutrition guidelines to follow: Cholesterol < 200mg /day Sodium < 1500mg /day Fiber: Men over 50 yrs - 30 grams per day  Personal Goals:  Personal Goals and Risk Factors at Admission - 07/04/22 1257       Core Components/Risk Factors/Patient Goals on Admission    Weight Management Yes;Weight Maintenance    Intervention Weight Management: Develop a combined nutrition and exercise program designed to reach desired caloric intake, while maintaining appropriate intake of nutrient and fiber, sodium and fats, and appropriate energy expenditure required for the weight goal.;Weight Management/Obesity: Establish reasonable short term and long term weight goals.;Weight Management: Provide education and appropriate resources to help participant work on and attain dietary goals.    Admit Weight 183 lb (83 kg)    Goal Weight: Short Term 183 lb (83 kg)    Goal Weight: Long Term 183 lb (83  kg)    Expected Outcomes Short Term: Continue to assess and modify interventions until short term weight is achieved;Long Term: Adherence to nutrition and physical activity/exercise program aimed toward attainment of established weight goal;Weight Maintenance: Understanding of the daily nutrition guidelines, which includes 25-35% calories from fat, 7% or less cal from saturated fats, less than 200mg  cholesterol, less than 1.5gm of sodium, & 5 or more servings of fruits and vegetables daily;Understanding recommendations for meals to include 15-35% energy as protein, 25-35% energy from fat, 35-60% energy from carbohydrates, less than 200mg  of dietary cholesterol, 20-35 gm of total fiber daily;Understanding of distribution of calorie intake throughout the day with the consumption of 4-5 meals/snacks    Tobacco Cessation Yes    Number of packs per day  Garrett Park. Intervention for tobacco cessation was provided at the initial medical review. He was asked about readiness to quit and reported that he has decreased number of cigarettes since surgery. Was at 1 1/4 pack a day. Is currently at 5-7 cigarettes a day, sometimes 10, and is planning to decrease to zero. He is not using anything at this time to help him. He does not have a set quit date . Patient was advised and educated about tobacco cessation using combination therapy, tobacco cessation classes, quit line, and quit smoking apps. Patient demonstrated understanding of this material. Staff will continue to provide encouragement and follow up with the patient throughout the program.    Intervention Assist the participant in steps to quit. Provide individualized education and counseling about committing to Tobacco Cessation, relapse prevention, and pharmacological support that can be provided by physician.; , assist with locating and accessing local/national Quit Smoking programs, and support quit date choice.    Expected Outcomes Short Term: Will demonstrate readiness to quit, by selecting a quit date.;Short Term: Will quit all tobacco product use, adhering to prevention of relapse plan.;Long Term: Complete abstinence from all tobacco products for at least 12 months from quit date.    Lipids Yes    Intervention Provide education and support for participant on nutrition & aerobic/resistive exercise along with prescribed medications to achieve LDL 70mg , HDL >40mg .    Expected Outcomes Short Term: Participant states understanding of desired cholesterol values and is compliant with medications prescribed. Participant is following exercise prescription and nutrition guidelines.;Long Term: Cholesterol controlled with medications as prescribed, with individualized exercise RX and with personalized nutrition plan. Value goals: LDL < 70mg , HDL > 40 mg.              Tobacco Use Initial Evaluation: Social History   Tobacco Use  Smoking Status Every Day   Packs/day: 1.00   Years: 40.00   Total pack years: 40.00   Types: Cigarettes  Smokeless Tobacco Never  Tobacco Comments   IS working on quitting. Down to 5-7 cigarettes a day . 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   No quit date set.     Exercise Goals and Review:  Exercise Goals     Row Name 07/04/22 1257             Exercise Goals   Increase Physical Activity Yes       Intervention Provide advice, education, support and counseling about physical activity/exercise needs.;Develop an individualized exercise prescription for aerobic and resistive training based on initial evaluation findings, risk stratification, comorbidities and participant's personal goals.       Expected Outcomes Short Term: Attend rehab on a regular basis  to increase amount of physical activity.;Long Term: Add in home exercise to make exercise part of routine and to increase amount of physical activity.;Long Term: Exercising regularly at least 3-5 days a week.       Increase Strength and Stamina Yes       Intervention Provide advice, education, support and counseling about physical activity/exercise needs.;Develop an individualized exercise prescription for aerobic and resistive training based on initial evaluation findings, risk stratification, comorbidities and participant's personal goals.       Expected Outcomes Short Term: Increase workloads from initial exercise prescription for resistance, speed, and METs.;Short Term: Perform resistance training exercises routinely during rehab and add in resistance training at home;Long Term: Improve cardiorespiratory fitness, muscular endurance and strength as measured by increased METs and functional capacity ( )       Able to understand and use rate of perceived exertion (RPE) scale Yes       Intervention Provide education and explanation on how to use RPE scale        Expected Outcomes Short Term: Able to use RPE daily in rehab to express subjective intensity level;Long Term:  Able to use RPE to guide intensity level when exercising independently       Able to understand and use Dyspnea scale Yes       Intervention Provide education and explanation on how to use Dyspnea scale       Expected Outcomes Short Term: Able to use Dyspnea scale daily in rehab to express subjective sense of shortness of breath during exertion;Long Term: Able to use Dyspnea scale to guide intensity level when exercising independently       Knowledge and understanding of Target Heart Rate Range (THRR) Yes       Intervention Provide education and explanation of THRR including how the numbers were predicted and where they are located for reference       Expected Outcomes Short Term: Able to state/look up THRR;Long Term: Able to use THRR to govern intensity when exercising independently;Short Term: Able to use daily as guideline for intensity in rehab       Able to check pulse independently Yes       Intervention Provide education and demonstration on how to check pulse in carotid and radial arteries.;Review the importance of being able to check your own pulse for safety during independent exercise       Expected Outcomes Short Term: Able to explain why pulse checking is important during independent exercise;Long Term: Able to check pulse independently and accurately       Understanding of Exercise Prescription Yes       Intervention Provide education, explanation, and written materials on patient's individual exercise prescription       Expected Outcomes Short Term: Able to explain program exercise prescription;Long Term: Able to explain home exercise prescription to exercise independently                Copy of goals given to participant.

## 2022-07-04 NOTE — Progress Notes (Signed)
Cardiac Individual Treatment Plan  Patient Details  Name: Garrett Park MRN: [id number] Date of Birth: 07-Apr-1966 Referring Provider:   Flowsheet Row Cardiac Rehab from 07/04/2022 in [facility] Cardiac and Pulmonary Rehab  Referring Provider Serafina Royals MD       Initial Encounter Date:  Flowsheet Row Cardiac Rehab from 07/04/2022 in [facility] Cardiac and Pulmonary Rehab  Date 07/04/22       Visit Diagnosis: NSTEMI (non-ST elevation myocardial infarction) ([place])  S/P CABG x 1  Patient's Home Medications on Admission:  Current Outpatient Medications:    albuterol (VENTOLIN HFA) 108 (90 Base) MCG/ACT inhaler, , Disp: , Rfl:    ascorbic acid (VITAMIN C) 500 MG tablet, Take by mouth., Disp: , Rfl:    aspirin EC 81 MG tablet, Take by mouth., Disp: , Rfl:    atorvastatin (LIPITOR) 20 MG tablet, Take by mouth., Disp: , Rfl:    clopidogrel (PLAVIX) 75 MG tablet, Take by mouth., Disp: , Rfl:    fluticasone (FLONASE ALLERGY RELIEF) 50 MCG/ACT nasal spray, Place into both nostrils daily as needed for allergies or rhinitis., Disp: , Rfl:    HYDROcodone-acetaminophen (NORCO/VICODIN) 5-325 MG tablet, Take 1 tablet by mouth every 6 (six) hours as needed for moderate pain or severe pain. Post-operatively., Disp: 15 tablet, Rfl: 0   melatonin 5 MG TABS, 1 tablet in the evening Orally Once a day, Disp: , Rfl:    metoprolol tartrate (LOPRESSOR) 25 MG tablet, Take by mouth., Disp: , Rfl:    oxyCODONE (OXY IR/ROXICODONE) 5 MG immediate release tablet, Take 1 tablet (5 mg total) by mouth every 6 (six) hours as needed for severe pain., Disp: 20 tablet, Rfl: 0   pantoprazole (PROTONIX) 40 MG tablet, Take by mouth., Disp: , Rfl:    senna-docusate (SENOKOT-S) 8.6-50 MG tablet, Take 1 tablet by mouth 2 (two) times daily. While taking strong pain meds to prevent constipation., Disp: 10 tablet, Rfl: 0   triprolidine-pseudoephedrine (APRODINE) 2.5-60 MG TABS tablet, Take 1 tablet by mouth every 6 (six) hours as needed for  allergies. actifed, Disp: , Rfl:   Past Medical History: Past Medical History:  Diagnosis Date   Anxiety yrs ago   Asthma    Chronic low back pain    Dyslipidemia    GERD (gastroesophageal reflux disease)    Heat rash    both arms and chest   Neck pain    OSA (obstructive sleep apnea)    intolerant of cpap   OSA and COPD overlap syndrome ([place]) 01/09/2016   uses online detal device occ   Peyronie disease     Tobacco Use: Social History   Tobacco Use  Smoking Status Every Day   Packs/day: 1.00   Years: 40.00   Total pack years: 40.00   Types: Cigarettes  Smokeless Tobacco Never  Tobacco Comments   IS working on quitting. Down to 5-7 cigarettes a day . 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   No quit date set.     Labs: Review Flowsheet        No data to display           Exercise Target Goals: Exercise Program Goal: Individual exercise prescription set using results from initial 6 min walk test and THRR while considering  patient's activity barriers and safety.   Exercise Prescription Goal: Initial exercise prescription builds to 30-45 minutes a day of aerobic activity, 2-3 days per week.  Home exercise guidelines will be given to patient during  program as part of exercise prescription that the participant will acknowledge.   Education: Aerobic Exercise: - Group verbal and visual presentation on the components of exercise prescription. Introduces F.I.T.T principle from ACSM for exercise prescriptions.  Reviews F.I.T.T. principles of aerobic exercise including progression. Written material given at graduation.   Education: Resistance Exercise: - Group verbal and visual presentation on the components of exercise prescription. Introduces F.I.T.T principle from ACSM for exercise prescriptions  Reviews F.I.T.T. principles of resistance exercise including progression. Written material given at graduation.    Education: Exercise & Equipment Safety: - Individual  verbal instruction and demonstration of equipment use and safety with use of the equipment. Flowsheet Row Cardiac Rehab from 07/04/2022 in [facility] Cardiac and Pulmonary Rehab  Education need identified 07/04/22  Date 07/04/22  Educator [place]  Instruction Review Code 1- Verbalizes Understanding       Education: Exercise Physiology & General Exercise Guidelines: - Group verbal and written instruction with models to review the exercise physiology of the cardiovascular system and associated critical values. Provides general exercise guidelines with specific guidelines to those with heart or lung disease.    Education: Flexibility, Balance, Mind/Body Relaxation: - Group verbal and visual presentation with interactive activity on the components of exercise prescription. Introduces F.I.T.T principle from ACSM for exercise prescriptions. Reviews F.I.T.T. principles of flexibility and balance exercise training including progression. Also discusses the mind body connection.  Reviews various relaxation techniques to help reduce and manage stress (i.e. Deep breathing, progressive muscle relaxation, and visualization). Balance handout provided to take home. Written material given at graduation.   Activity Barriers & Risk Stratification:  Activity Barriers & Cardiac Risk Stratification - 07/04/22 1239       Activity Barriers & Cardiac Risk Stratification   Activity Barriers Back Problems;Joint Problems;Other (comment)    Comments History of fractured hip    Cardiac Risk Stratification High             6 Minute Walk:  6 Minute Walk     Row Name 07/04/22 1253         6 Minute Walk   Phase Initial     Distance 1145 feet     Walk Time 6 minutes     # of Rest Breaks 0     MPH 2.16     METS 3.35     RPE 9     Perceived Dyspnea  0     VO2 Peak 11.75     Symptoms Yes (comment)     Comments Bilateral hip pain 4/10     Resting HR 73 bpm     Resting BP 110/66     Resting Oxygen Saturation  99 %      Exercise Oxygen Saturation  during 6 min walk 100 %     Max Ex. HR 87 bpm     Max Ex. BP 120/64     2 Minute Post BP 112/66              Oxygen Initial Assessment:   Oxygen Re-Evaluation:   Oxygen Discharge (Final Oxygen Re-Evaluation):   Initial Exercise Prescription:  Initial Exercise Prescription - 07/04/22 1200       Date of Initial Exercise RX and Referring Provider   Date 07/04/22    Referring Provider Serafina Royals MD      Oxygen   Maintain Oxygen Saturation 88% or higher      Treadmill   MPH 2.2    Grade 1  Minutes 15    METs 2.99      Recumbant Bike   Level 3    RPM 60    Watts 30    Minutes 15    METs 3.3      REL-XR   Level 2    Speed 50    Minutes 15    METs 3.3      T5 Nustep   Level 2    SPM 80    Minutes 15    METs 3.3      Prescription Details   Frequency (times per week) 3    Duration Progress to 30 minutes of continuous aerobic without signs/symptoms of physical distress      Intensity   THRR 40-80% of Max Heartrate 109 - 145    Ratings of Perceived Exertion 11-13    Perceived Dyspnea 0-4      Progression   Progression Continue to progress workloads to maintain intensity without signs/symptoms of physical distress.      Resistance Training   Training Prescription Yes    Weight 3 lb    Reps 10-15             Perform Capillary Blood Glucose checks as needed.  Exercise Prescription Changes:   Exercise Prescription Changes     Row Name 07/04/22 1200             Response to Exercise   Blood Pressure (Admit) 110/66       Blood Pressure (Exercise) 120/64       Blood Pressure (Exit) 112/66       Heart Rate (Admit) 83 bpm       Heart Rate (Exercise) 87 bpm       Heart Rate (Exit) 75 bpm       Oxygen Saturation (Admit) 99 %       Oxygen Saturation (Exercise) 100 %       Oxygen Saturation (Exit) 99 %       Rating of Perceived Exertion (Exercise) 9       Perceived Dyspnea (Exercise) 0       Symptoms  bilateral hip pain 4/10       Comments walk test results                Exercise Comments:   Exercise Goals and Review:   Exercise Goals     Row Name 07/04/22 1257             Exercise Goals   Increase Physical Activity Yes       Intervention Provide advice, education, support and counseling about physical activity/exercise needs.;Develop an individualized exercise prescription for aerobic and resistive training based on initial evaluation findings, risk stratification, comorbidities and participant's personal goals.       Expected Outcomes Short Term: Attend rehab on a regular basis to increase amount of physical activity.;Long Term: Add in home exercise to make exercise part of routine and to increase amount of physical activity.;Long Term: Exercising regularly at least 3-5 days a week.       Increase Strength and Stamina Yes       Intervention Provide advice, education, support and counseling about physical activity/exercise needs.;Develop an individualized exercise prescription for aerobic and resistive training based on initial evaluation findings, risk stratification, comorbidities and participant's personal goals.       Expected Outcomes Short Term: Increase workloads from initial exercise prescription for resistance, speed, and METs.;Short Term: Perform resistance training exercises routinely during rehab  and add in resistance training at home;Long Term: Improve cardiorespiratory fitness, muscular endurance and strength as measured by increased METs and functional capacity ( )       Able to understand and use rate of perceived exertion (RPE) scale Yes       Intervention Provide education and explanation on how to use RPE scale       Expected Outcomes Short Term: Able to use RPE daily in rehab to express subjective intensity level;Long Term:  Able to use RPE to guide intensity level when exercising independently       Able to understand and use Dyspnea scale Yes        Intervention Provide education and explanation on how to use Dyspnea scale       Expected Outcomes Short Term: Able to use Dyspnea scale daily in rehab to express subjective sense of shortness of breath during exertion;Long Term: Able to use Dyspnea scale to guide intensity level when exercising independently       Knowledge and understanding of Target Heart Rate Range (THRR) Yes       Intervention Provide education and explanation of THRR including how the numbers were predicted and where they are located for reference       Expected Outcomes Short Term: Able to state/look up THRR;Long Term: Able to use THRR to govern intensity when exercising independently;Short Term: Able to use daily as guideline for intensity in rehab       Able to check pulse independently Yes       Intervention Provide education and demonstration on how to check pulse in carotid and radial arteries.;Review the importance of being able to check your own pulse for safety during independent exercise       Expected Outcomes Short Term: Able to explain why pulse checking is important during independent exercise;Long Term: Able to check pulse independently and accurately       Understanding of Exercise Prescription Yes       Intervention Provide education, explanation, and written materials on patient's individual exercise prescription       Expected Outcomes Short Term: Able to explain program exercise prescription;Long Term: Able to explain home exercise prescription to exercise independently                Exercise Goals Re-Evaluation :   Discharge Exercise Prescription (Final Exercise Prescription Changes):  Exercise Prescription Changes - 07/04/22 1200       Response to Exercise   Blood Pressure (Admit) 110/66    Blood Pressure (Exercise) 120/64    Blood Pressure (Exit) 112/66    Heart Rate (Admit) 83 bpm    Heart Rate (Exercise) 87 bpm    Heart Rate (Exit) 75 bpm    Oxygen Saturation (Admit) 99 %    Oxygen  Saturation (Exercise) 100 %    Oxygen Saturation (Exit) 99 %    Rating of Perceived Exertion (Exercise) 9    Perceived Dyspnea (Exercise) 0    Symptoms bilateral hip pain 4/10    Comments walk test results             Nutrition:  Target Goals: Understanding of nutrition guidelines, daily intake of sodium 1500mg , cholesterol 200mg , calories 30% from fat and 7% or less from saturated fats, daily to have 5 or more servings of fruits and vegetables.  Education: All About Nutrition: -Group instruction provided by verbal, written material, interactive activities, discussions, models, and posters to present general guidelines for heart healthy nutrition including fat, fiber,  MyPlate, the role of sodium in heart healthy nutrition, utilization of the nutrition label, and utilization of this knowledge for meal planning. Follow up email sent as well. Written material given at graduation. Flowsheet Row Cardiac Rehab from 07/04/2022 in [facility] Cardiac and Pulmonary Rehab  Education need identified 07/04/22       Biometrics:  Pre Biometrics - 07/04/22 1238       Pre Biometrics   Height 5' 11.5" (1.816 m)    Weight 183 lb 14.4 oz (83.4 kg)    Waist Circumference 39 inches    Hip Circumference 41 inches    Waist to Hip Ratio 0.95 %    BMI (Calculated) 25.29    Single Leg Stand 30 seconds              Nutrition Therapy Plan and Nutrition Goals:  Nutrition Therapy & Goals - 07/04/22 1226       Intervention Plan   Intervention Prescribe, educate and counsel regarding individualized specific dietary modifications aiming towards targeted core components such as weight, hypertension, lipid management, diabetes, heart failure and other comorbidities.    Expected Outcomes Short Term Goal: Understand basic principles of dietary content, such as calories, fat, sodium, cholesterol and nutrients.;Short Term Goal: A plan has been developed with personal nutrition goals set during dietitian  appointment.;Long Term Goal: Adherence to prescribed nutrition plan.             Nutrition Assessments:  MEDIFICTS Score Key: ?70 Need to make dietary changes  40-70 Heart Healthy Diet ? 40 Therapeutic Level Cholesterol Diet  Flowsheet Row Cardiac Rehab from 07/04/2022 in [facility] Cardiac and Pulmonary Rehab  Picture Your Plate Total Score on Admission 37      Picture Your Plate Scores: <32 Unhealthy dietary pattern with much room for improvement. 41-50 Dietary pattern unlikely to meet recommendations for good health and room for improvement. 51-60 More healthful dietary pattern, with some room for improvement.  >60 Healthy dietary pattern, although there may be some specific behaviors that could be improved.    Nutrition Goals Re-Evaluation:   Nutrition Goals Discharge (Final Nutrition Goals Re-Evaluation):   Psychosocial: Target Goals: Acknowledge presence or absence of significant depression and/or stress, maximize coping skills, provide positive support system. Participant is able to verbalize types and ability to use techniques and skills needed for reducing stress and depression.   Education: Stress, Anxiety, and Depression - Group verbal and visual presentation to define topics covered.  Reviews how body is impacted by stress, anxiety, and depression.  Also discusses healthy ways to reduce stress and to treat/manage anxiety and depression.  Written material given at graduation.   Education: Sleep Hygiene -Provides group verbal and written instruction about how sleep can affect your health.  Define sleep hygiene, discuss sleep cycles and impact of sleep habits. Review good sleep hygiene tips.    Initial Review & Psychosocial Screening:  Initial Psych Review & Screening - 06/26/22 0912       Initial Review   Current issues with None Identified      Family Dynamics   Good Support System? Yes   wife,neice and her child, neighbors, family     Barriers   Psychosocial  barriers to participate in program There are no identifiable barriers or psychosocial needs.      Screening Interventions   Interventions Encouraged to exercise;To provide support and resources with identified psychosocial needs;Provide feedback about the scores to participant    Expected Outcomes Short Term goal: Utilizing psychosocial counselor,  staff and physician to assist with identification of specific Stressors or current issues interfering with healing process. Setting desired goal for each stressor or current issue identified.;Long Term Goal: Stressors or current issues are controlled or eliminated.;Short Term goal: Identification and review with participant of any Quality of Life or Depression concerns found by scoring the questionnaire.;Long Term goal: The participant improves quality of Life and PHQ9 Scores as seen by post scores and/or verbalization of changes             Quality of Life Scores:   Quality of Life - 07/04/22 1232       Quality of Life   Select Quality of Life      Quality of Life Scores   Health/Function Pre 20.83 %    Socioeconomic Pre 27.43 %    Psych/Spiritual Pre 28.29 %    Family Pre 28.8 %    GLOBAL Pre 24.9 %            Scores of 19 and below usually indicate a poorer quality of life in these areas.  A difference of  2-3 points is a clinically meaningful difference.  A difference of 2-3 points in the total score of the Quality of Life Index has been associated with significant improvement in overall quality of life, self-image, physical symptoms, and general health in studies assessing change in quality of life.  PHQ-9: Review Flowsheet        No data to display         Interpretation of Total Score  Total Score Depression Severity:  1-4 = Minimal depression, 5-9 = Mild depression, 10-14 = Moderate depression, 15-19 = Moderately severe depression, 20-27 = Severe depression   Psychosocial Evaluation and Intervention:  Psychosocial  Evaluation - 06/26/22 0923       Psychosocial Evaluation & Interventions   Interventions Encouraged to exercise with the program and follow exercise prescription    Comments Isaid has no barriers to attending the program. He wants to learn all he can to know what to expect. He lives with his wife. He has 2 grown children. His wife , family, neighbors and friends are his support.  He is ready to get started.    Expected Outcomes STG [place]den attends all scheduled sessions, he is able to progress with his exercise,despite his back problems. LTG Hashim is able to continue his progression after discharge    Continue Psychosocial Services  Follow up required by staff             Psychosocial Re-Evaluation:   Psychosocial Discharge (Final Psychosocial Re-Evaluation):   Vocational Rehabilitation: Provide vocational rehab assistance to qualifying candidates.   Vocational Rehab Evaluation & Intervention:   Education: Education Goals: Education classes will be provided on a variety of topics geared toward better understanding of heart health and risk factor modification. Participant will state understanding/return demonstration of topics presented as noted by education test scores.  Learning Barriers/Preferences:   General Cardiac Education Topics:  AED/CPR: - Group verbal and written instruction with the use of models to demonstrate the basic use of the AED with the basic ABC's of resuscitation.   Anatomy and Cardiac Procedures: - Group verbal and visual presentation and models provide information about basic cardiac anatomy and function. Reviews the testing methods done to diagnose heart disease and the outcomes of the test results. Describes the treatment choices: Medical Management, Angioplasty, or Coronary Bypass Surgery for treating various heart conditions including Myocardial Infarction, Angina, Valve Disease, and Cardiac  Arrhythmias.  Written material given at  graduation. Flowsheet Row Cardiac Rehab from 07/04/2022 in [facility] Cardiac and Pulmonary Rehab  Education need identified 07/04/22       Medication Safety: - Group verbal and visual instruction to review commonly prescribed medications for heart and lung disease. Reviews the medication, class of the drug, and side effects. Includes the steps to properly store meds and maintain the prescription regimen.  Written material given at graduation.   Intimacy: - Group verbal instruction through game format to discuss how heart and lung disease can affect sexual intimacy. Written material given at graduation..   Know Your Numbers and Heart Failure: - Group verbal and visual instruction to discuss disease risk factors for cardiac and pulmonary disease and treatment options.  Reviews associated critical values for Overweight/Obesity, Hypertension, Cholesterol, and Diabetes.  Discusses basics of heart failure: signs/symptoms and treatments.  Introduces Heart Failure Zone chart for action plan for heart failure.  Written material given at graduation.   Infection Prevention: - Provides verbal and written material to individual with discussion of infection control including proper hand washing and proper equipment cleaning during exercise session. Flowsheet Row Cardiac Rehab from 07/04/2022 in [facility] Cardiac and Pulmonary Rehab  Education need identified 07/04/22  Date 07/04/22  Educator [place]  Instruction Review Code 1- Verbalizes Understanding       Falls Prevention: - Provides verbal and written material to individual with discussion of falls prevention and safety. Flowsheet Row Cardiac Rehab from 06/26/2022 in [facility] Cardiac and Pulmonary Rehab  Date 06/26/22  Educator [place]  Instruction Review Code 1- Verbalizes Understanding       Other: -Provides group and verbal instruction on various topics (see comments)   Knowledge Questionnaire Score:  Knowledge Questionnaire Score - 07/04/22 1226        Knowledge Questionnaire Score   Pre Score 24/26             Core Components/Risk Factors/Patient Goals at Admission:  Personal Goals and Risk Factors at Admission - 07/04/22 1257       Core Components/Risk Factors/Patient Goals on Admission    Weight Management Yes;Weight Maintenance    Intervention Weight Management: Develop a combined nutrition and exercise program designed to reach desired caloric intake, while maintaining appropriate intake of nutrient and fiber, sodium and fats, and appropriate energy expenditure required for the weight goal.;Weight Management/Obesity: Establish reasonable short term and long term weight goals.;Weight Management: Provide education and appropriate resources to help participant work on and attain dietary goals.    Admit Weight 183 lb (83 kg)    Goal Weight: Short Term 183 lb (83 kg)    Goal Weight: Long Term 183 lb (83 kg)    Expected Outcomes Short Term: Continue to assess and modify interventions until short term weight is achieved;Long Term: Adherence to nutrition and physical activity/exercise program aimed toward attainment of established weight goal;Weight Maintenance: Understanding of the daily nutrition guidelines, which includes 25-35% calories from fat, 7% or less cal from saturated fats, less than 200mg  cholesterol, less than 1.5gm of sodium, & 5 or more servings of fruits and vegetables daily;Understanding recommendations for meals to include 15-35% energy as protein, 25-35% energy from fat, 35-60% energy from carbohydrates, less than 200mg  of dietary cholesterol, 20-35 gm of total fiber daily;Understanding of distribution of calorie intake throughout the day with the consumption of 4-5 meals/snacks    Tobacco Cessation Yes    Number of packs per day Nancy is a current tobacco user. Intervention for tobacco  cessation was provided at the initial medical review. He was asked about readiness to quit and reported that he has decreased number of  cigarettes since surgery. Was at 1 1/4 pack a day. Is currently at 5-7 cigarettes a day, sometimes 10, and is planning to decrease to zero. He is not using anything at this time to help him. He does not have a set quit date . Patient was advised and educated about tobacco cessation using combination therapy, tobacco cessation classes, quit line, and quit smoking apps. Patient demonstrated understanding of this material. Staff will continue to provide encouragement and follow up with the patient throughout the program.    Intervention Assist the participant in steps to quit. Provide individualized education and counseling about committing to Tobacco Cessation, relapse prevention, and pharmacological support that can be provided by physician.;Advice worker, assist with locating and accessing local/national Quit Smoking programs, and support quit date choice.    Expected Outcomes Short Term: Will demonstrate readiness to quit, by selecting a quit date.;Short Term: Will quit all tobacco product use, adhering to prevention of relapse plan.;Long Term: Complete abstinence from all tobacco products for at least 12 months from quit date.    Lipids Yes    Intervention Provide education and support for participant on nutrition & aerobic/resistive exercise along with prescribed medications to achieve LDL 70mg , HDL >40mg .    Expected Outcomes Short Term: Participant states understanding of desired cholesterol values and is compliant with medications prescribed. Participant is following exercise prescription and nutrition guidelines.;Long Term: Cholesterol controlled with medications as prescribed, with individualized exercise RX and with personalized nutrition plan. Value goals: LDL < 70mg , HDL > 40 mg.             Education:Diabetes - Individual verbal and written instruction to review signs/symptoms of diabetes, desired ranges of glucose level fasting, after meals and with exercise. Acknowledge  that pre and post exercise glucose checks will be done for 3 sessions at entry of program.   Core Components/Risk Factors/Patient Goals Review:    Core Components/Risk Factors/Patient Goals at Discharge (Final Review):    ITP Comments:  ITP Comments     Row Name 06/26/22 0922 06/26/22 0932 07/04/22 1222       ITP Comments Virtual orientation call completed today. he has an appointment on Date: 07/04/2022  for EP eval and gym Orientation.  Documentation of diagnosis can be found in [facility] Date: 05/29/2022 . Zubin is a current tobacco user. Intervention for tobacco cessation was provided at the initial medical review. He was asked about readiness to quit and reported that he has decreased number of cigarettes since surgery. Was at 1 1/4 pack a day. Is currently at 5-7 cigarettes a day and is planning to decrease to zero. He does not have a set quit date . Patient was advised and educated about tobacco cessation using combination therapy, tobacco cessation classes, quit line, and quit smoking apps. Patient demonstrated understanding of this material. Staff will continue to provide encouragement and follow up with the patient throughout the program. Completed 6MWT and gym orientation. Initial ITP created and sent for review to Dr. Emily Filbert, Medical Director.              Comments: Initial ITP

## 2022-07-09 ENCOUNTER — Encounter: Payer: BC Managed Care – PPO | Admitting: *Deleted

## 2022-07-09 DIAGNOSIS — I252 Old myocardial infarction: Secondary | ICD-10-CM | POA: Diagnosis not present

## 2022-07-09 DIAGNOSIS — Z48812 Encounter for surgical aftercare following surgery on the circulatory system: Secondary | ICD-10-CM | POA: Diagnosis not present

## 2022-07-09 DIAGNOSIS — Z951 Presence of aortocoronary bypass graft: Secondary | ICD-10-CM | POA: Diagnosis not present

## 2022-07-09 DIAGNOSIS — I214 Non-ST elevation (NSTEMI) myocardial infarction: Secondary | ICD-10-CM | POA: Diagnosis not present

## 2022-07-09 NOTE — Progress Notes (Signed)
Daily Session Note  Patient Details  Name: Garrett Park MRN: [id number] Date of Birth: 1966-07-12 Referring Provider:   Flowsheet Row Cardiac Rehab from 07/04/2022 in [facility] Cardiac and Pulmonary Rehab  Referring Provider Serafina Royals MD       Encounter Date: 07/09/2022  Check In:  Session Check In - 07/09/22 1339       Check-In   Supervising physician immediately available to respond to emergencies See telemetry face sheet for immediately available ER MD    Location [facility]-Cardiac & Pulmonary Rehab    Staff Present Renita Papa, RN BSN;Joseph [place], RCP,RRT,BSRT;Kara [place], MS, ASCM CEP, Exercise Physiologist;Noah Tickle, BS, Exercise Physiologist    Virtual Visit No    Medication changes reported     No    Fall or balance concerns reported    No    Tobacco Cessation No Change    Current number of cigarettes/nicotine per day     10    Warm-up and Cool-down Performed on first and last piece of equipment    Resistance Training Performed Yes    VAD Patient? No    PAD/SET Patient? No      Pain Assessment   Currently in Pain? No/denies                Social History   Tobacco Use  Smoking Status Every Day   Packs/day: 1.00   Years: 40.00   Total pack years: 40.00   Types: Cigarettes  Smokeless Tobacco Never  Tobacco Comments   IS working on quitting. Down to 5-7 cigarettes a day . 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   No quit date set.     Goals Met:  Independence with exercise equipment Exercise tolerated well No report of concerns or symptoms today Strength training completed today  Goals Unmet:  Not Applicable  Comments: First full day of exercise!  Patient was oriented to gym and equipment including functions, settings, policies, and procedures.  Patient's individual exercise prescription and treatment plan were reviewed.  All starting workloads were established based on the results of the 6 minute walk test done at initial orientation visit.  The  plan for exercise progression was also introduced and progression will be customized based on patient's performance and goals.    Dr. Emily Filbert is Medical Director for [place].  Dr. Ottie Glazier is Medical Director for [facility] Pulmonary Rehabilitation.

## 2022-07-11 ENCOUNTER — Encounter: Payer: BC Managed Care – PPO | Admitting: *Deleted

## 2022-07-11 DIAGNOSIS — Z951 Presence of aortocoronary bypass graft: Secondary | ICD-10-CM

## 2022-07-11 DIAGNOSIS — I214 Non-ST elevation (NSTEMI) myocardial infarction: Secondary | ICD-10-CM

## 2022-07-11 DIAGNOSIS — Z48812 Encounter for surgical aftercare following surgery on the circulatory system: Secondary | ICD-10-CM | POA: Diagnosis not present

## 2022-07-11 DIAGNOSIS — I252 Old myocardial infarction: Secondary | ICD-10-CM | POA: Diagnosis not present

## 2022-07-11 NOTE — Progress Notes (Signed)
Daily Session Note  Patient Details  Name: Garrett Park MRN: [id number] Date of Birth: 1966-02-11 Referring Provider:   Flowsheet Row Cardiac Rehab from 07/04/2022 in [facility] Cardiac and Pulmonary Rehab  Referring Provider Serafina Royals MD       Encounter Date: 07/11/2022  Check In:  Session Check In - 07/11/22 1352       Check-In   Supervising physician immediately available to respond to emergencies See telemetry face sheet for immediately available ER MD    Location [facility]-Cardiac & Pulmonary Rehab    Staff Present Renita Papa, RN BSN;Joseph John [place], RCP,RRT,BSRT;Noah [place], [place], Exercise Physiologist    Virtual Visit No    Medication changes reported     No    Fall or balance concerns reported    No    Tobacco Cessation No Change    Current number of cigarettes/nicotine per day     10    Warm-up and Cool-down Performed on first and last piece of equipment    Resistance Training Performed Yes    VAD Patient? No    PAD/SET Patient? No      Pain Assessment   Currently in Pain? No/denies                Social History   Tobacco Use  Smoking Status Every Day   Packs/day: 1.00   Years: 40.00   Total pack years: 40.00   Types: Cigarettes  Smokeless Tobacco Never  Tobacco Comments   IS working on quitting. Down to 5-7 cigarettes a day . 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   No quit date set.     Goals Met:  Independence with exercise equipment Exercise tolerated well No report of concerns or symptoms today Strength training completed today  Goals Unmet:  Not Applicable  Comments: Pt able to follow exercise prescription today without complaint.  Will continue to monitor for progression.    Dr. Emily Filbert is Medical Director for [place].  Dr. Ottie Glazier is Medical Director for [facility] Pulmonary Rehabilitation.

## 2022-07-12 DIAGNOSIS — G4733 Obstructive sleep apnea (adult) (pediatric): Secondary | ICD-10-CM | POA: Diagnosis not present

## 2022-07-16 ENCOUNTER — Encounter: Payer: BC Managed Care – PPO | Admitting: *Deleted

## 2022-07-16 DIAGNOSIS — I214 Non-ST elevation (NSTEMI) myocardial infarction: Secondary | ICD-10-CM | POA: Diagnosis not present

## 2022-07-16 DIAGNOSIS — I252 Old myocardial infarction: Secondary | ICD-10-CM | POA: Diagnosis not present

## 2022-07-16 DIAGNOSIS — Z951 Presence of aortocoronary bypass graft: Secondary | ICD-10-CM

## 2022-07-16 DIAGNOSIS — Z48812 Encounter for surgical aftercare following surgery on the circulatory system: Secondary | ICD-10-CM | POA: Diagnosis not present

## 2022-07-16 NOTE — Progress Notes (Signed)
Daily Session Note  Patient Details  Name: Garrett Park MRN: [id number] Date of Birth: [birth year] Referring Provider:   Flowsheet Row Cardiac Rehab from 07/04/2022 in [facility] Cardiac and Pulmonary Rehab  Referring Provider Garrett Royals MD       Encounter Date: 07/16/2022  Check In:  Session Check In - 07/16/22 1348       Check-In   Supervising physician immediately available to respond to emergencies See telemetry face sheet for immediately available ER MD    Location [facility]-Cardiac & Pulmonary Rehab    Staff Present Renita Papa, RN Moises Blood, BS, ACSM CEP, Exercise Physiologist;Joseph Tessie [place], [place]    Virtual Visit No    Medication changes reported     No    Fall or balance concerns reported    No    Tobacco Cessation No Change    Current number of cigarettes/nicotine per day     10    Warm-up and Cool-down Performed on first and last piece of equipment    Resistance Training Performed Yes    VAD Patient? No    PAD/SET Patient? No      Pain Assessment   Currently in Pain? No/denies                Social History   Tobacco Use  Smoking Status Every Day   Packs/day: 1.00   Years: 40.00   Total pack years: 40.00   Types: Cigarettes  Smokeless Tobacco Never  Tobacco Comments   IS working on quitting. Down to 5-7 cigarettes a day . 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   No quit date set.     Goals Met:  Independence with exercise equipment Exercise tolerated well No report of concerns or symptoms today Strength training completed today  Goals Unmet:  Not Applicable  Comments: Pt able to follow exercise prescription today without complaint.  Will continue to monitor for progression.    Dr. Emily Park is Medical Director for [name].  Dr. Ottie Park is Medical Director for [facility] Pulmonary Rehabilitation.

## 2022-07-18 ENCOUNTER — Encounter: Payer: BC Managed Care – PPO | Admitting: *Deleted

## 2022-07-18 DIAGNOSIS — I214 Non-ST elevation (NSTEMI) myocardial infarction: Secondary | ICD-10-CM

## 2022-07-18 DIAGNOSIS — I252 Old myocardial infarction: Secondary | ICD-10-CM | POA: Diagnosis not present

## 2022-07-18 DIAGNOSIS — Z951 Presence of aortocoronary bypass graft: Secondary | ICD-10-CM

## 2022-07-18 DIAGNOSIS — Z48812 Encounter for surgical aftercare following surgery on the circulatory system: Secondary | ICD-10-CM | POA: Diagnosis not present

## 2022-07-18 NOTE — Progress Notes (Signed)
Daily Session Note  Patient Details  Name: Garrett Park MRN: [id number] Date of Birth: February 09, 1966 Referring Provider:   Flowsheet Row Cardiac Rehab from 07/04/2022 in [facility] Cardiac and Pulmonary Rehab  Referring Provider Serafina Royals MD       Encounter Date: 07/18/2022  Check In:  Session Check In - 07/18/22 1345       Check-In   Supervising physician immediately available to respond to emergencies See telemetry face sheet for immediately available ER MD    Location [facility]-Cardiac & Pulmonary Rehab    Staff Present Renita Papa, RN BSN;Joseph [place], RCP,RRT,BSRT;Laureen [place], [place], RRT, CPFT    Virtual Visit No    Medication changes reported     No    Fall or balance concerns reported    No    Tobacco Cessation No Change    Warm-up and Cool-down Performed on first and last piece of equipment    Resistance Training Performed Yes    VAD Patient? No    PAD/SET Patient? No      Pain Assessment   Currently in Pain? No/denies                Social History   Tobacco Use  Smoking Status Every Day   Packs/day: 1.00   Years: 40.00   Total pack years: 40.00   Types: Cigarettes  Smokeless Tobacco Never  Tobacco Comments   IS working on quitting. Down to 5-7 cigarettes a day . 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   No quit date set.     Goals Met:  Independence with exercise equipment Exercise tolerated well No report of concerns or symptoms today Strength training completed today  Goals Unmet:  Not Applicable  Comments: Pt able to follow exercise prescription today without complaint.  Will continue to monitor for progression.    Dr. Emily Filbert is Medical Director for [place].  Dr. Ottie Glazier is Medical Director for [facility] Pulmonary Rehabilitation.

## 2022-07-19 ENCOUNTER — Encounter: Payer: BC Managed Care – PPO | Admitting: *Deleted

## 2022-07-19 DIAGNOSIS — Z48812 Encounter for surgical aftercare following surgery on the circulatory system: Secondary | ICD-10-CM | POA: Diagnosis not present

## 2022-07-19 DIAGNOSIS — Z951 Presence of aortocoronary bypass graft: Secondary | ICD-10-CM | POA: Diagnosis not present

## 2022-07-19 DIAGNOSIS — I214 Non-ST elevation (NSTEMI) myocardial infarction: Secondary | ICD-10-CM | POA: Diagnosis not present

## 2022-07-19 DIAGNOSIS — I252 Old myocardial infarction: Secondary | ICD-10-CM | POA: Diagnosis not present

## 2022-07-19 NOTE — Progress Notes (Signed)
Daily Session Note  Patient Details  Name: Garrett Park MRN: [id number] Date of Birth: 1966-03-13 Referring Provider:   Flowsheet Row Cardiac Rehab from 07/04/2022 in [facility] Cardiac and Pulmonary Rehab  Referring Provider Serafina Royals MD       Encounter Date: 07/19/2022  Check In:  Session Check In - 07/19/22 1405       Check-In   Supervising physician immediately available to respond to emergencies See telemetry face sheet for immediately available ER MD    Location [facility]-Cardiac & Pulmonary Rehab    Staff Present Renita Papa, RN BSN;Joseph [place], RCP,RRT,BSRT;Jessica [place], [place], RCEP, CCRP, CCET    Virtual Visit No    Medication changes reported     No    Fall or balance concerns reported    No    Tobacco Cessation No Change    Current number of cigarettes/nicotine per day     10    Warm-up and Cool-down Performed on first and last piece of equipment    Resistance Training Performed Yes    VAD Patient? No    PAD/SET Patient? No      Pain Assessment   Currently in Pain? No/denies                Social History   Tobacco Use  Smoking Status Every Day   Packs/day: 1.00   Years: 40.00   Total pack years: 40.00   Types: Cigarettes  Smokeless Tobacco Never  Tobacco Comments   IS working on quitting. Down to 5-7 cigarettes a day . 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   No quit date set.     Goals Met:  Independence with exercise equipment Exercise tolerated well No report of concerns or symptoms today Strength training completed today  Goals Unmet:  Not Applicable  Comments: Pt able to follow exercise prescription today without complaint.  Will continue to monitor for progression.    Dr. Emily Filbert is Medical Director for [place].  Dr. Ottie Glazier is Medical Director for [facility] Pulmonary Rehabilitation.

## 2022-07-22 IMAGING — CT CT CHEST LUNG CANCER SCREENING LOW DOSE W/O CM
1 series · 15 of 31 positions shown, 19 images · non-contrast
Comparison: None.

CLINICAL DATA: 56-year-old male current smoker, with 40 pack-year
history of smoking, for initial lung cancer screening



[Series 2: ldct screening <30 bmi · axial · 0.76mm/px · z∈[+970,+1300]mm · 15 of 72 slices shown, 19 images]
[im 3/72  mediastinal]
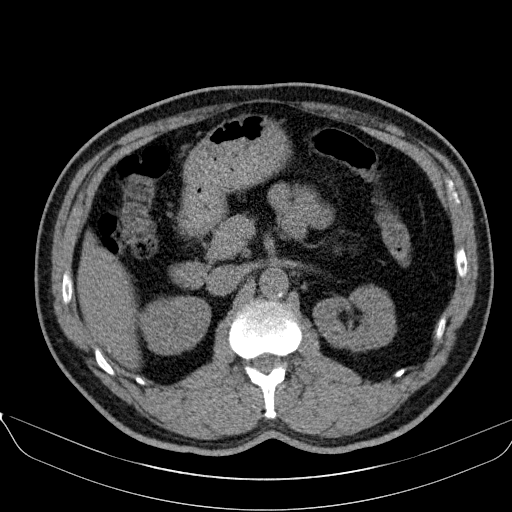
[im 3/72  lung]
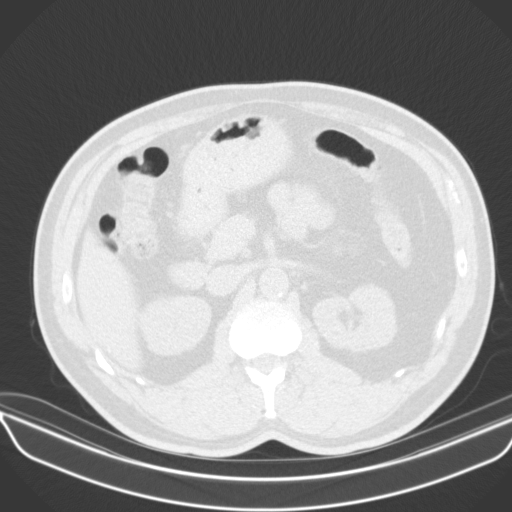
[im 8/72  lung]
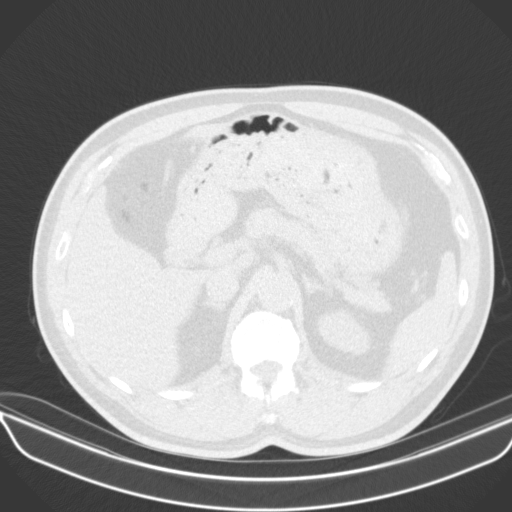
[im 14/72  lung]
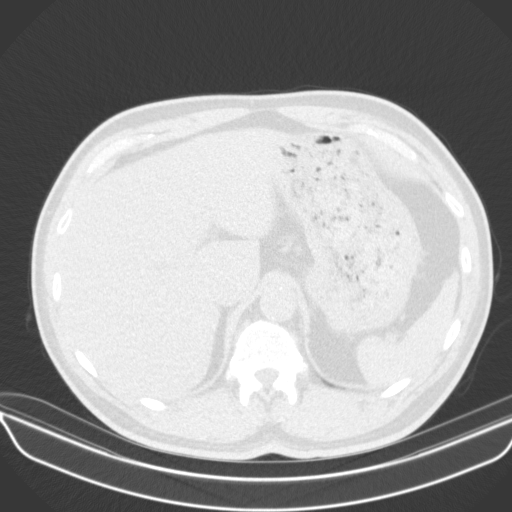
[im 16/72  lung]
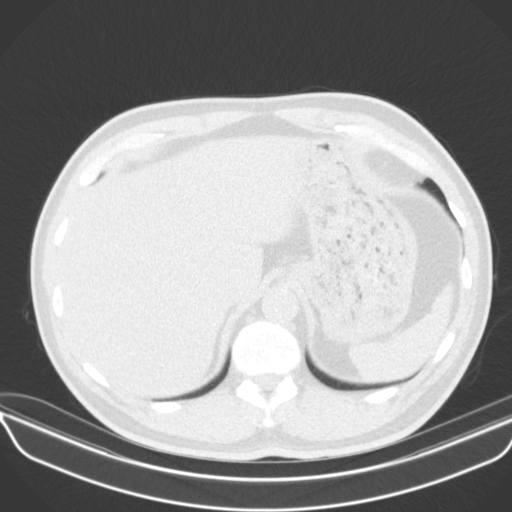
[im 22/72  mediastinal]
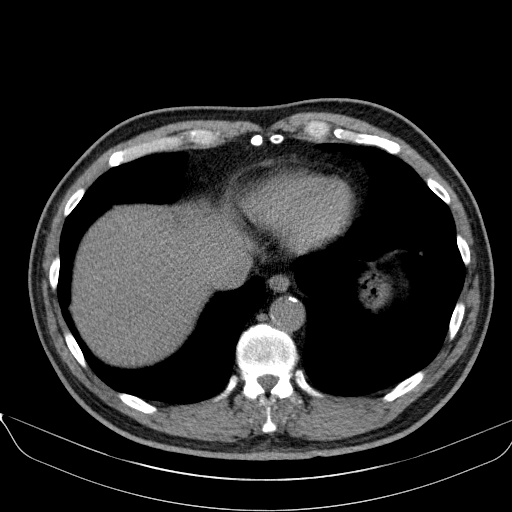
[im 22/72  lung]
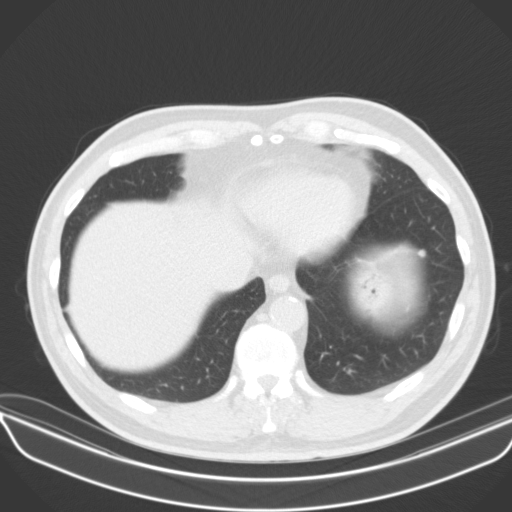
[im 27/72  lung]
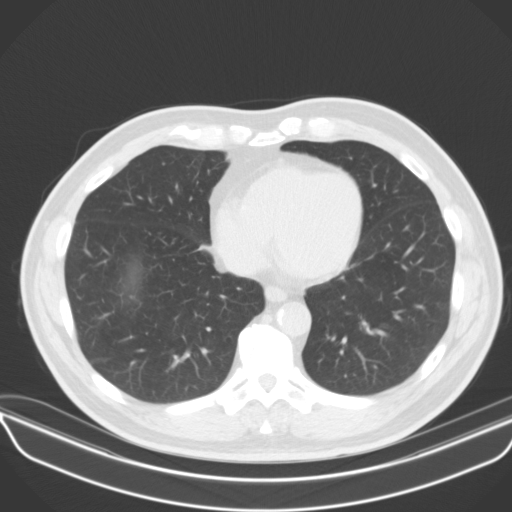
[im 32/72  lung]
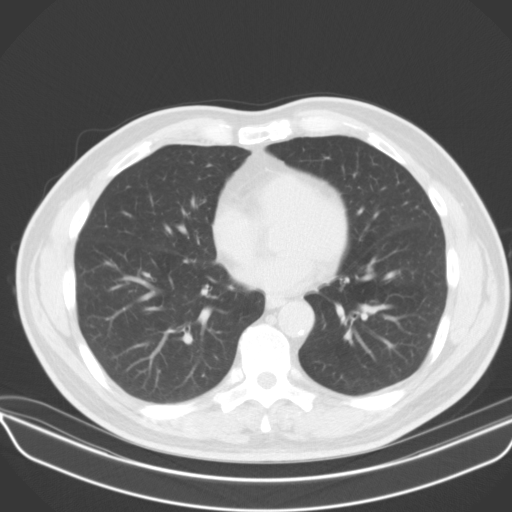
[im 37/72  lung]
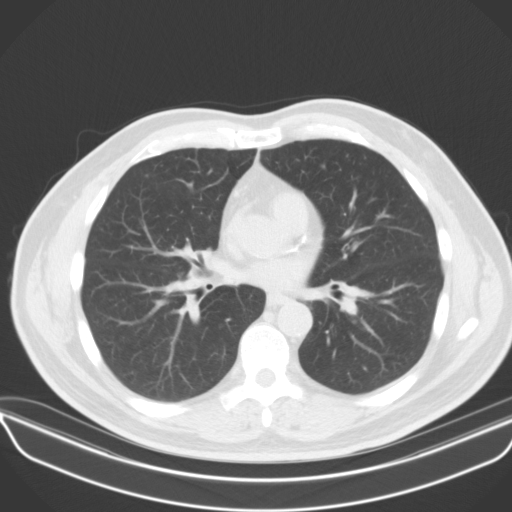
[im 40/72  mediastinal]
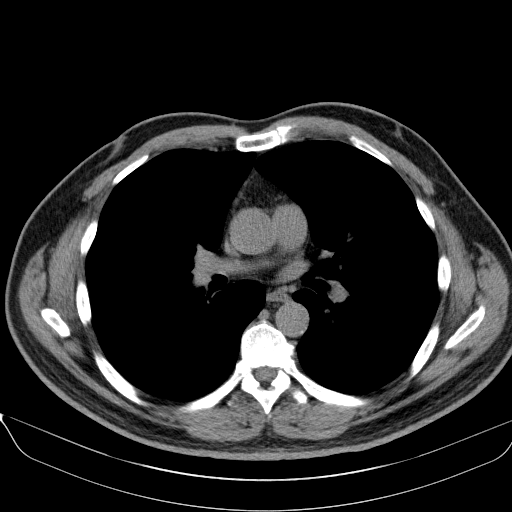
[im 40/72  lung]
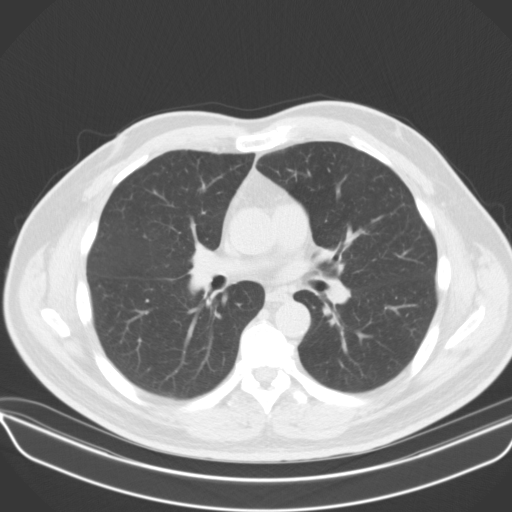
[im 45/72  lung]
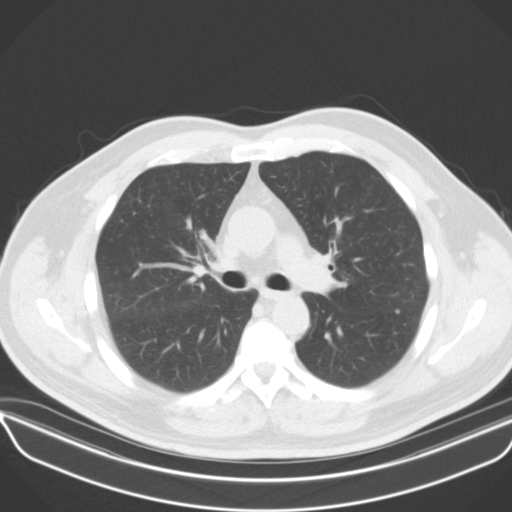
[im 50/72  lung]
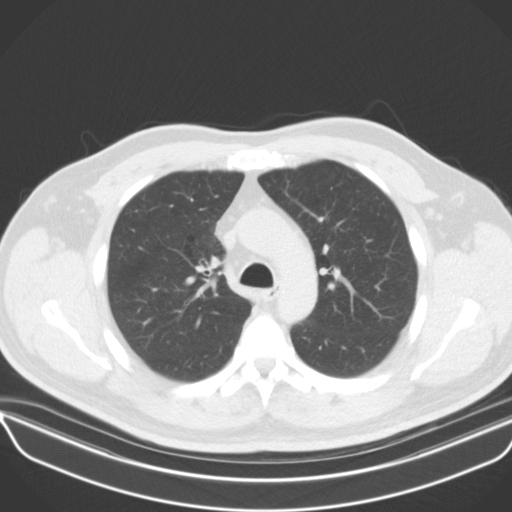
[im 56/72  lung]
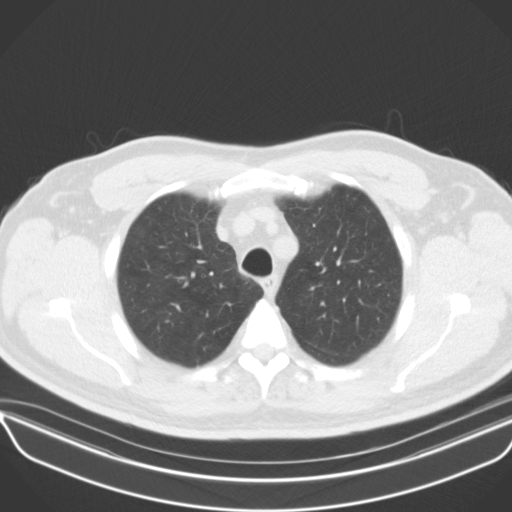
[im 58/72  mediastinal]
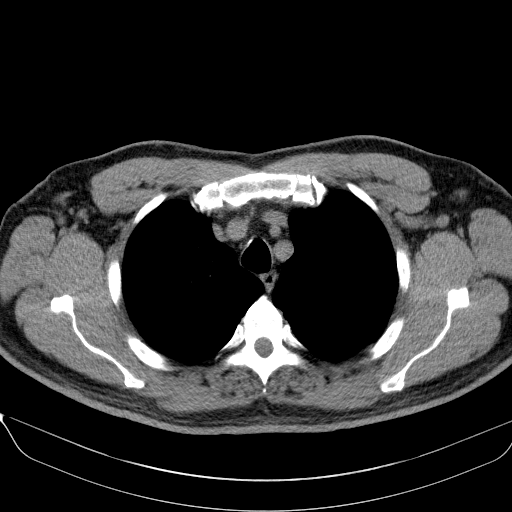
[im 58/72  lung]
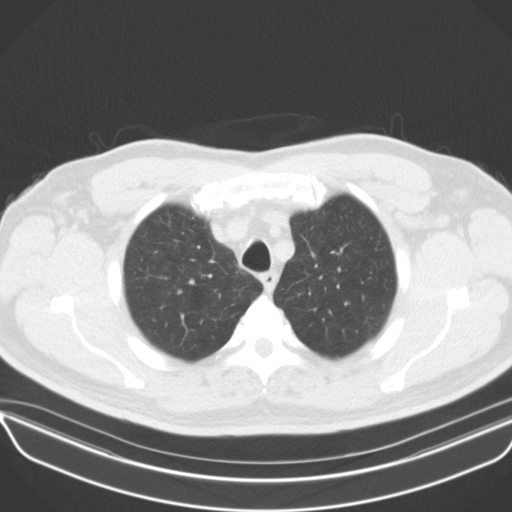
[im 64/72  lung]
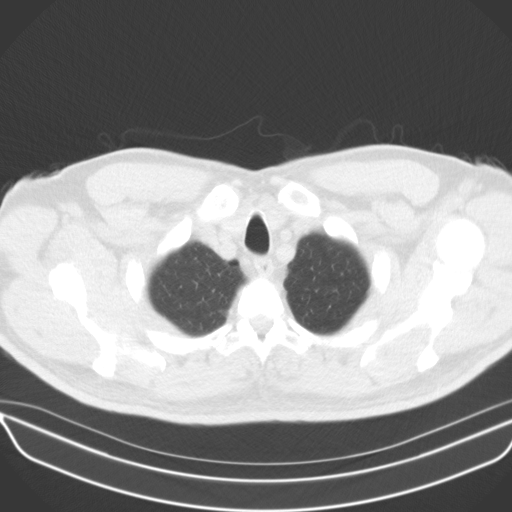
[im 69/72  lung]
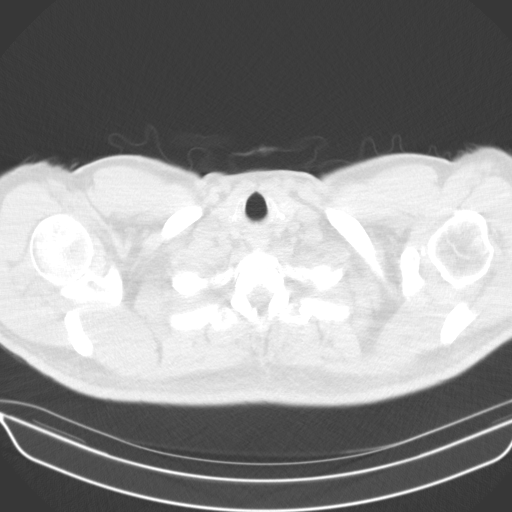

[15 of 31 positions shown; findings below may reference images not displayed]

FINDINGS: Cardiovascular: The heart is normal in size. No pericardial
effusion.

No evidence of thoracic aortic aneurysm. Mild atherosclerotic
calcifications of the arch.

Coronary atherosclerosis of the LAD.

Mediastinum/Nodes: No suspicious mediastinal lymphadenopathy.

Visualized thyroid is unremarkable.

Lungs/Pleura: Mild biapical parenchymal scarring.

Mild centrilobular and paraseptal emphysematous changes, upper lung
predominant.

No focal consolidation.

Scattered subpleural nodules in the lungs bilaterally, measuring up
to 5.7 mm at the left lung base.

No pleural effusion or pneumothorax.

Upper Abdomen: Visualized upper abdomen is grossly unremarkable,
noting vascular calcifications.

Musculoskeletal: Visualized osseous structures are within normal
limits.
IMPRESSION: Lung-RADS 2, benign appearance or behavior. Continue annual
screening with low-dose chest CT without contrast in 12 months.

Aortic Atherosclerosis (DEZZU-4CB.B) and Emphysema (DEZZU-7CB.N).

## 2022-07-23 ENCOUNTER — Encounter: Payer: BC Managed Care – PPO | Admitting: *Deleted

## 2022-07-23 ENCOUNTER — Encounter: Payer: Self-pay | Admitting: Cardiothoracic Surgery

## 2022-07-23 ENCOUNTER — Ambulatory Visit: Payer: BC Managed Care – PPO | Admitting: Cardiothoracic Surgery

## 2022-07-23 VITALS — BP 117/71 | HR 81 | Resp 18 | Ht 71.0 in | Wt 189.0 lb

## 2022-07-23 DIAGNOSIS — Z48812 Encounter for surgical aftercare following surgery on the circulatory system: Secondary | ICD-10-CM | POA: Diagnosis not present

## 2022-07-23 DIAGNOSIS — I214 Non-ST elevation (NSTEMI) myocardial infarction: Secondary | ICD-10-CM | POA: Diagnosis not present

## 2022-07-23 DIAGNOSIS — I252 Old myocardial infarction: Secondary | ICD-10-CM | POA: Diagnosis not present

## 2022-07-23 DIAGNOSIS — Z951 Presence of aortocoronary bypass graft: Secondary | ICD-10-CM | POA: Diagnosis not present

## 2022-07-23 DIAGNOSIS — Z4889 Encounter for other specified surgical aftercare: Secondary | ICD-10-CM | POA: Diagnosis not present

## 2022-07-23 NOTE — Progress Notes (Signed)
HPI The patient returns for scheduled postop follow-up 2 months after urgent CABG x1 in [place] where he was visiting.  He is doing well.  He has had no recurrent angina.  Incision is healing well.  The surgical pain is improved and he is no longer on narcotics.  Postoperative chest x-ray was clear.  Patient smokes 5 to 8 cigarettes daily and is trying to get off tobacco.  He has been compliant with his medications and is taking both Plavix and aspirin 81 mg as well as Lipitor and metoprolol.  Patient has not yet been driving.  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  Current Outpatient Medications  Medication Sig Dispense Refill   albuterol (VENTOLIN HFA) 108 (90 Base) MCG/ACT inhaler      ascorbic acid (VITAMIN C) 500 MG tablet Take by mouth.     aspirin EC 81 MG tablet Take by mouth.     atorvastatin (LIPITOR) 20 MG tablet Take by mouth.     clopidogrel (PLAVIX) 75 MG tablet Take by mouth.     fluticasone (FLONASE ALLERGY RELIEF) 50 MCG/ACT nasal spray Place into both nostrils daily as needed for allergies or rhinitis.     melatonin 5 MG TABS 1 tablet in the evening Orally Once a day     metoprolol tartrate (LOPRESSOR) 25 MG tablet Take by mouth.     pantoprazole (PROTONIX) 40 MG tablet Take by mouth.     No current facility-administered medications for this visit.     Review of Systems:   Physical Exam Blood pressure 117/71, pulse 81, resp. rate 18, height 5\' 11"  (1.803 m), weight 189 lb (85.7 kg), SpO2 98 %.   Patient is alert and appropriate Lungs are clear Sternal incision looks great, stable without click Heart rate regular without murmur or gallop Extremities without edema, pulses intact   Diagnostic Tests: None  Impression: Doing very well now 2 months after urgent CABG x1 left IMA to LAD.  Surgical pain is significantly better.  He can now drive, lift up to 15 -20pounds, start some yard work and return to the tattoo occupation.  Plan: I will see  him back in 4 weeks for final postoperative visit.  Importance of smoking cessation reviewed with patient as well as heart healthy lifestyle.    Dahlia Byes, MD [facility] [phone]

## 2022-07-23 NOTE — Progress Notes (Addendum)
Daily Session Note  Patient Details  Name: Garrett Park MRN: [id number] Date of Birth: 12-24-1965 Referring Provider:   Flowsheet Row Cardiac Rehab from 07/04/2022 in [facility] Cardiac and Pulmonary Rehab  Referring Provider Serafina Royals MD       Encounter Date: 07/23/2022  Check In:  Session Check In - 07/23/22 1337       Check-In   Supervising physician immediately available to respond to emergencies See telemetry face sheet for immediately available ER MD    Location [facility]-Cardiac & Pulmonary Rehab    Staff Present Renita Papa, RN BSN;Joseph [place], RCP,RRT,BSRT;Kara [place], [place], ASCM CEP, Exercise Physiologist    Virtual Visit No    Medication changes reported     No    Fall or balance concerns reported    No    Tobacco Cessation Use Decreased    Current number of cigarettes/nicotine per day     8    Warm-up and Cool-down Performed on first and last piece of equipment    Resistance Training Performed Yes    VAD Patient? No    PAD/SET Patient? No      Pain Assessment   Currently in Pain? No/denies                Social History   Tobacco Use  Smoking Status Every Day   Packs/day: 1.00   Years: 40.00   Total pack years: 40.00   Types: Cigarettes  Smokeless Tobacco Never  Tobacco Comments   IS working on quitting. Down to 5-7 cigarettes a day . 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   No quit date set.     Goals Met:  Independence with exercise equipment Exercise tolerated well No report of concerns or symptoms today Strength training completed today  Goals Unmet:  Not Applicable  Comments: Pt able to follow exercise prescription today without complaint.  Will continue to monitor for progression.    Dr. Emily Filbert is Medical Director for [place].  Dr. Ottie Glazier is Medical Director for [facility] Pulmonary Rehabilitation.

## 2022-07-25 ENCOUNTER — Encounter: Payer: BC Managed Care – PPO | Admitting: *Deleted

## 2022-07-25 DIAGNOSIS — I214 Non-ST elevation (NSTEMI) myocardial infarction: Secondary | ICD-10-CM

## 2022-07-25 DIAGNOSIS — Z48812 Encounter for surgical aftercare following surgery on the circulatory system: Secondary | ICD-10-CM | POA: Diagnosis not present

## 2022-07-25 DIAGNOSIS — Z951 Presence of aortocoronary bypass graft: Secondary | ICD-10-CM | POA: Diagnosis not present

## 2022-07-25 DIAGNOSIS — I252 Old myocardial infarction: Secondary | ICD-10-CM | POA: Diagnosis not present

## 2022-07-25 NOTE — Progress Notes (Signed)
Daily Session Note  Patient Details  Name: Garrett Park MRN: [id number] Date of Birth: 04/08/1966 Referring Provider:   Flowsheet Row Cardiac Rehab from 07/04/2022 in [facility] Cardiac and Pulmonary Rehab  Referring Provider Serafina Royals MD       Encounter Date: 07/25/2022  Check In:  Session Check In - 07/25/22 1402       Check-In   Supervising physician immediately available to respond to emergencies See telemetry face sheet for immediately available ER MD    Location [facility]-Cardiac & Pulmonary Rehab    Staff Present Coralie Keens, MS, ASCM CEP, Exercise Physiologist;Joseph Rosebud Poles, RN, [place]    Virtual Visit No    Medication changes reported     No    Fall or balance concerns reported    No    Tobacco Cessation No Change    Warm-up and Cool-down Performed on first and last piece of equipment    Resistance Training Performed Yes    VAD Patient? No    PAD/SET Patient? No      Pain Assessment   Currently in Pain? No/denies                Social History   Tobacco Use  Smoking Status Every Day   Packs/day: 1.00   Years: 40.00   Total pack years: 40.00   Types: Cigarettes  Smokeless Tobacco Never  Tobacco Comments   IS working on quitting. Down to 5-7 cigarettes a day . 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   No quit date set.     Goals Met:  Independence with exercise equipment Exercise tolerated well No report of concerns or symptoms today Strength training completed today  Goals Unmet:  Not Applicable  Comments: Pt able to follow exercise prescription today without complaint.  Will continue to monitor for progression.    Dr. Emily Filbert is Medical Director for [place].  Dr. Ottie Glazier is Medical Director for [facility] Pulmonary Rehabilitation.

## 2022-07-26 ENCOUNTER — Encounter: Payer: BC Managed Care – PPO | Admitting: *Deleted

## 2022-07-26 DIAGNOSIS — I214 Non-ST elevation (NSTEMI) myocardial infarction: Secondary | ICD-10-CM | POA: Diagnosis not present

## 2022-07-26 DIAGNOSIS — Z951 Presence of aortocoronary bypass graft: Secondary | ICD-10-CM | POA: Diagnosis not present

## 2022-07-26 DIAGNOSIS — I252 Old myocardial infarction: Secondary | ICD-10-CM | POA: Diagnosis not present

## 2022-07-26 DIAGNOSIS — Z48812 Encounter for surgical aftercare following surgery on the circulatory system: Secondary | ICD-10-CM | POA: Diagnosis not present

## 2022-07-26 NOTE — Progress Notes (Signed)
Daily Session Note  Patient Details  Name: JAICOB DIA MRN: [id number] Date of Birth: 04/09/1966 Referring Provider:   Flowsheet Row Cardiac Rehab from 07/04/2022 in [facility] Cardiac and Pulmonary Rehab  Referring Provider Serafina Royals MD       Encounter Date: 07/26/2022  Check In:  Session Check In - 07/26/22 1340       Check-In   Supervising physician immediately available to respond to emergencies See telemetry face sheet for immediately available ER MD    Location [facility]-Cardiac & Pulmonary Rehab    Staff Present Justin Mend, RCP,RRT,BSRT;Densel Kronick Sherryll Burger, RN BSN;Jessica Luan [place], MA, RCEP, CCRP, CCET    Virtual Visit No    Medication changes reported     No    Fall or balance concerns reported    No    Tobacco Cessation Use Increase    Current number of cigarettes/nicotine per day     10    Warm-up and Cool-down Performed on first and last piece of equipment    Resistance Training Performed Yes    VAD Patient? No    PAD/SET Patient? No      Pain Assessment   Currently in Pain? No/denies                Social History   Tobacco Use  Smoking Status Every Day   Packs/day: 1.00   Years: 40.00   Total pack years: 40.00   Types: Cigarettes  Smokeless Tobacco Never  Tobacco Comments   IS working on quitting. Down to 5-7 cigarettes a day . 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   No quit date set.     Goals Met:  Independence with exercise equipment Exercise tolerated well No report of concerns or symptoms today Strength training completed today  Goals Unmet:  Not Applicable  Comments: Pt able to follow exercise prescription today without complaint.  Will continue to monitor for progression.    Dr. Emily Filbert is Medical Director for [place].  Dr. Ottie Glazier is Medical Director for [facility] Pulmonary Rehabilitation.

## 2022-07-30 ENCOUNTER — Encounter: Payer: BC Managed Care – PPO | Attending: Internal Medicine | Admitting: *Deleted

## 2022-07-30 DIAGNOSIS — Z951 Presence of aortocoronary bypass graft: Secondary | ICD-10-CM | POA: Diagnosis not present

## 2022-07-30 DIAGNOSIS — Z48812 Encounter for surgical aftercare following surgery on the circulatory system: Secondary | ICD-10-CM | POA: Insufficient documentation

## 2022-07-30 DIAGNOSIS — I214 Non-ST elevation (NSTEMI) myocardial infarction: Secondary | ICD-10-CM | POA: Insufficient documentation

## 2022-07-30 NOTE — Progress Notes (Signed)
Daily Session Note  Patient Details  Name: WOLF BOULAY MRN: [id number] Date of Birth: January 11, 1966 Referring Provider:   Flowsheet Row Cardiac Rehab from 07/04/2022 in [facility] Cardiac and Pulmonary Rehab  Referring Provider Serafina Royals MD       Encounter Date: 07/30/2022  Check In:  Session Check In - 07/30/22 1342       Check-In   Supervising physician immediately available to respond to emergencies See telemetry face sheet for immediately available ER MD    Location [facility]-Cardiac & Pulmonary Rehab    Staff Present Justin Mend, Sharren [place], MS, ASCM CEP, Exercise Physiologist;Artemis Koller Sherryll Burger, RN BSN    Virtual Visit No    Medication changes reported     No    Fall or balance concerns reported    No    Tobacco Cessation No Change    Current number of cigarettes/nicotine per day     10    Warm-up and Cool-down Performed on first and last piece of equipment    Resistance Training Performed Yes    VAD Patient? No    PAD/SET Patient? No      Pain Assessment   Currently in Pain? No/denies                Social History   Tobacco Use  Smoking Status Every Day   Packs/day: 1.00   Years: 40.00   Total pack years: 40.00   Types: Cigarettes  Smokeless Tobacco Never  Tobacco Comments   IS working on quitting. Down to 5-7 cigarettes a day . 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   No quit date set.     Goals Met:  Independence with exercise equipment Exercise tolerated well No report of concerns or symptoms today Strength training completed today  Goals Unmet:  Not Applicable  Comments: Pt able to follow exercise prescription today without complaint.  Will continue to monitor for progression.    Dr. Emily Filbert is Medical Director for [place].  Dr. Ottie Glazier is Medical Director for [facility] Pulmonary Rehabilitation.

## 2022-08-01 ENCOUNTER — Encounter: Payer: Self-pay | Admitting: *Deleted

## 2022-08-01 ENCOUNTER — Encounter: Payer: BC Managed Care – PPO | Admitting: *Deleted

## 2022-08-01 DIAGNOSIS — I214 Non-ST elevation (NSTEMI) myocardial infarction: Secondary | ICD-10-CM

## 2022-08-01 DIAGNOSIS — Z951 Presence of aortocoronary bypass graft: Secondary | ICD-10-CM | POA: Diagnosis not present

## 2022-08-01 DIAGNOSIS — Z48812 Encounter for surgical aftercare following surgery on the circulatory system: Secondary | ICD-10-CM | POA: Diagnosis not present

## 2022-08-01 NOTE — Progress Notes (Signed)
Cardiac Individual Treatment Plan  Patient Details  Name: Garrett Park MRN: [id number] Date of Birth: 09-Oct-1966 Referring Provider:   Flowsheet Row Cardiac Rehab from 07/04/2022 in [facility] Cardiac and Pulmonary Rehab  Referring Provider Serafina Royals MD       Initial Encounter Date:  Flowsheet Row Cardiac Rehab from 07/04/2022 in [facility] Cardiac and Pulmonary Rehab  Date 07/04/22       Visit Diagnosis: NSTEMI (non-ST elevation myocardial infarction) ([place])  S/P CABG x 1  Patient's Home Medications on Admission:  Current Outpatient Medications:    albuterol (VENTOLIN HFA) 108 (90 Base) MCG/ACT inhaler, , Disp: , Rfl:    ascorbic acid (VITAMIN C) 500 MG tablet, Take by mouth., Disp: , Rfl:    aspirin EC 81 MG tablet, Take by mouth., Disp: , Rfl:    atorvastatin (LIPITOR) 20 MG tablet, Take by mouth., Disp: , Rfl:    clopidogrel (PLAVIX) 75 MG tablet, Take by mouth., Disp: , Rfl:    fluticasone (FLONASE ALLERGY RELIEF) 50 MCG/ACT nasal spray, Place into both nostrils daily as needed for allergies or rhinitis., Disp: , Rfl:    melatonin 5 MG TABS, 1 tablet in the evening Orally Once a day, Disp: , Rfl:    metoprolol tartrate (LOPRESSOR) 25 MG tablet, Take by mouth., Disp: , Rfl:    pantoprazole (PROTONIX) 40 MG tablet, Take by mouth., Disp: , Rfl:   Past Medical History: Past Medical History:  Diagnosis Date   Anxiety yrs ago   Asthma    Chronic low back pain    Dyslipidemia    GERD (gastroesophageal reflux disease)    Heat rash    both arms and chest   Neck pain    OSA (obstructive sleep apnea)    intolerant of cpap   OSA and COPD overlap syndrome ([place]) 01/09/2016   uses online detal device occ   Peyronie disease     Tobacco Use: Social History   Tobacco Use  Smoking Status Every Day   Packs/day: 1.00   Years: 40.00   Total pack years: 40.00   Types: Cigarettes  Smokeless Tobacco Never  Tobacco Comments   IS working on quitting. Down to 5-7 cigarettes a day . Was  1 1/4 pack prior to CABG surgery.   No quit date set.     Labs: Review Flowsheet        No data to display           Exercise Target Goals: Exercise Program Goal: Individual exercise prescription set using results from initial 6 min walk test and THRR while considering  patient's activity barriers and safety.   Exercise Prescription Goal: Initial exercise prescription builds to 30-45 minutes a day of aerobic activity, 2-3 days per week.  Home exercise guidelines will be given to patient during program as part of exercise prescription that the participant will acknowledge.   Education: Aerobic Exercise: - Group verbal and visual presentation on the components of exercise prescription. Introduces F.I.T.T principle from ACSM for exercise prescriptions.  Reviews F.I.T.T. principles of aerobic exercise including progression. Written material given at graduation.   Education: Resistance Exercise: - Group verbal and visual presentation on the components of exercise prescription. Introduces F.I.T.T principle from ACSM for exercise prescriptions  Reviews F.I.T.T. principles of resistance exercise including progression. Written material given at graduation. Flowsheet Row Cardiac Rehab from 07/18/2022 in [facility] Cardiac and Pulmonary Rehab  Date 07/11/22  Educator NT  Instruction Review Code 1- Verbalizes Understanding  Education: Exercise & Equipment Safety: - Individual verbal instruction and demonstration of equipment use and safety with use of the equipment. Flowsheet Row Cardiac Rehab from 07/18/2022 in [facility] Cardiac and Pulmonary Rehab  Education need identified 07/04/22  Date 07/04/22  Educator [place]  Instruction Review Code 1- Verbalizes Understanding       Education: Exercise Physiology & General Exercise Guidelines: - Group verbal and written instruction with models to review the exercise physiology of the cardiovascular system and associated critical values. Provides  general exercise guidelines with specific guidelines to those with heart or lung disease.    Education: Flexibility, Balance, Mind/Body Relaxation: - Group verbal and visual presentation with interactive activity on the components of exercise prescription. Introduces F.I.T.T principle from ACSM for exercise prescriptions. Reviews F.I.T.T. principles of flexibility and balance exercise training including progression. Also discusses the mind body connection.  Reviews various relaxation techniques to help reduce and manage stress (i.e. Deep breathing, progressive muscle relaxation, and visualization). Balance handout provided to take home. Written material given at graduation. Flowsheet Row Cardiac Rehab from 07/18/2022 in [facility] Cardiac and Pulmonary Rehab  Date 07/11/22  Educator NT  Instruction Review Code 1- Verbalizes Understanding       Activity Barriers & Risk Stratification:  Activity Barriers & Cardiac Risk Stratification - 07/04/22 1239       Activity Barriers & Cardiac Risk Stratification   Activity Barriers Back Problems;Joint Problems;Other (comment)    Comments History of fractured hip    Cardiac Risk Stratification High             6 Minute Walk:  6 Minute Walk     Row Name 07/04/22 1253         6 Minute Walk   Phase Initial     Distance 1145 feet     Walk Time 6 minutes     # of Rest Breaks 0     MPH 2.16     METS 3.35     RPE 9     Perceived Dyspnea  0     VO2 Peak 11.75     Symptoms Yes (comment)     Comments Bilateral hip pain 4/10     Resting HR 73 bpm     Resting BP 110/66     Resting Oxygen Saturation  99 %     Exercise Oxygen Saturation  during 6 min walk 100 %     Max Ex. HR 87 bpm     Max Ex. BP 120/64     2 Minute Post BP 112/66              Oxygen Initial Assessment:   Oxygen Re-Evaluation:   Oxygen Discharge (Final Oxygen Re-Evaluation):   Initial Exercise Prescription:  Initial Exercise Prescription - 07/04/22 1200        Date of Initial Exercise RX and Referring Provider   Date 07/04/22    Referring Provider Serafina Royals MD      Oxygen   Maintain Oxygen Saturation 88% or higher      Treadmill   MPH 2.2    Grade 1    Minutes 15    METs 2.99      Recumbant Bike   Level 3    RPM 60    Watts 30    Minutes 15    METs 3.3      REL-XR   Level 2    Speed 50    Minutes 15    METs 3.3  T5 Nustep   Level 2    SPM 80    Minutes 15    METs 3.3      Prescription Details   Frequency (times per week) 3    Duration Progress to 30 minutes of continuous aerobic without signs/symptoms of physical distress      Intensity   THRR 40-80% of Max Heartrate 109 - 145    Ratings of Perceived Exertion 11-13    Perceived Dyspnea 0-4      Progression   Progression Continue to progress workloads to maintain intensity without signs/symptoms of physical distress.      Resistance Training   Training Prescription Yes    Weight 3 lb    Reps 10-15             Perform Capillary Blood Glucose checks as needed.  Exercise Prescription Changes:   Exercise Prescription Changes     Row Name 07/04/22 1200 07/23/22 1400 07/25/22 1500         Response to Exercise   Blood Pressure (Admit) 110/66 128/72 --     Blood Pressure (Exercise) 120/64 140/60 --     Blood Pressure (Exit) 112/66 122/68 --     Heart Rate (Admit) 83 bpm 70 bpm --     Heart Rate (Exercise) 87 bpm 115 bpm --     Heart Rate (Exit) 75 bpm 79 bpm --     Oxygen Saturation (Admit) 99 % -- --     Oxygen Saturation (Exercise) 100 % -- --     Oxygen Saturation (Exit) 99 % -- --     Rating of Perceived Exertion (Exercise) 9 13 --     Perceived Dyspnea (Exercise) 0 -- --     Symptoms bilateral hip pain 4/10 hip pain 5/10 --     Comments walk test results First full week of exercise --     Duration -- Continue with 30 min of aerobic exercise without signs/symptoms of physical distress. --     Intensity -- THRR unchanged --        Progression   Progression -- Continue to progress workloads to maintain intensity without signs/symptoms of physical distress. --     Average METs -- 3.23 --       Resistance Training   Training Prescription -- Yes --     Weight -- 3 lb --     Reps -- 10-15 --       Interval Training   Interval Training -- No --       Treadmill   MPH -- 2.2 --     Grade -- 1.5 --     Minutes -- 15 --     METs -- 3.14 --       Recumbant Bike   Level -- 5 --     Watts -- 33 --     Minutes -- 15 --     METs -- 3.37 --       REL-XR   Level -- 11 --     Minutes -- 15 --     METs -- 4.5 --       T5 Nustep   Level -- 4 --     Minutes -- 15 --     METs -- 2.7 --       Home Exercise Plan   Plans to continue exercise at -- -- Home (comment)  walking     Frequency -- -- Add 2 additional days to program  exercise sessions.     Initial Home Exercises Provided -- -- 07/25/22       Oxygen   Maintain Oxygen Saturation -- 88% or higher 88% or higher              Exercise Comments:   Exercise Goals and Review:   Exercise Goals     Row Name 07/04/22 1257             Exercise Goals   Increase Physical Activity Yes       Intervention Provide advice, education, support and counseling about physical activity/exercise needs.;Develop an individualized exercise prescription for aerobic and resistive training based on initial evaluation findings, risk stratification, comorbidities and participant's personal goals.       Expected Outcomes Short Term: Attend rehab on a regular basis to increase amount of physical activity.;Long Term: Add in home exercise to make exercise part of routine and to increase amount of physical activity.;Long Term: Exercising regularly at least 3-5 days a week.       Increase Strength and Stamina Yes       Intervention Provide advice, education, support and counseling about physical activity/exercise needs.;Develop an individualized exercise prescription for aerobic and  resistive training based on initial evaluation findings, risk stratification, comorbidities and participant's personal goals.       Expected Outcomes Short Term: Increase workloads from initial exercise prescription for resistance, speed, and METs.;Short Term: Perform resistance training exercises routinely during rehab and add in resistance training at home;Long Term: Improve cardiorespiratory fitness, muscular endurance and strength as measured by increased METs and functional capacity (6MWT)       Able to understand and use rate of perceived exertion (RPE) scale Yes       Intervention Provide education and explanation on how to use RPE scale       Expected Outcomes Short Term: Able to use RPE daily in rehab to express subjective intensity level;Long Term:  Able to use RPE to guide intensity level when exercising independently       Able to understand and use Dyspnea scale Yes       Intervention Provide education and explanation on how to use Dyspnea scale       Expected Outcomes Short Term: Able to use Dyspnea scale daily in rehab to express subjective sense of shortness of breath during exertion;Long Term: Able to use Dyspnea scale to guide intensity level when exercising independently       Knowledge and understanding of Target Heart Rate Range (THRR) Yes       Intervention Provide education and explanation of THRR including how the numbers were predicted and where they are located for reference       Expected Outcomes Short Term: Able to state/look up THRR;Long Term: Able to use THRR to govern intensity when exercising independently;Short Term: Able to use daily as guideline for intensity in rehab       Able to check pulse independently Yes       Intervention Provide education and demonstration on how to check pulse in carotid and radial arteries.;Review the importance of being able to check your own pulse for safety during independent exercise       Expected Outcomes Short Term: Able to explain  why pulse checking is important during independent exercise;Long Term: Able to check pulse independently and accurately       Understanding of Exercise Prescription Yes       Intervention Provide education, explanation, and written materials on patient's  individual exercise prescription       Expected Outcomes Short Term: Able to explain program exercise prescription;Long Term: Able to explain home exercise prescription to exercise independently                Exercise Goals Re-Evaluation :  Exercise Goals Re-Evaluation     Row Name 07/09/22 1341 07/23/22 1414 07/25/22 1514         Exercise Goal Re-Evaluation   Exercise Goals Review Increase Physical Activity;Able to understand and use rate of perceived exertion (RPE) scale;Knowledge and understanding of Target Heart Rate Range (THRR);Understanding of Exercise Prescription;Increase Strength and Stamina;Able to check pulse independently Increase Physical Activity;Understanding of Exercise Prescription;Increase Strength and Stamina Increase Physical Activity;Understanding of Exercise Prescription;Increase Strength and Stamina;Able to understand and use Dyspnea scale;Able to check pulse independently;Able to understand and use rate of perceived exertion (RPE) scale;Knowledge and understanding of Target Heart Rate Range (THRR)     Comments Reviewed RPE and dyspnea scales, THR and program prescription with pt today.  Pt voiced understanding and was given a copy of goals to take home. Garrett Park is off to a good start in rehab. He had an overall average MET level of 3.23 METs. He also increased his incline on the treadmill to 1.5% while maintaining a speed of 2.2 mph. He also improved to level 11 on the XR and level 4 on the T5. We will continue to monitor his progress in the program. Reviewed home exercise with pt today.  Pt plans to walk for exercise.  Reviewed THR, pulse, RPE, sign and symptoms, pulse oximetery and when to call 911 or MD.  Also discussed  weather considerations and indoor options.  Pt voiced understanding.     Expected Outcomes Short: Use RPE daily to regulate intensity. Long: Follow program prescription in THR. Short: Continue to increase workloads. Long: Continue to increase strength and stamina. Short: Structure walk to build up to 30 minutes at once Long: Continue independent exercise at appropriate prescription              Discharge Exercise Prescription (Final Exercise Prescription Changes):  Exercise Prescription Changes - 07/25/22 1500       Home Exercise Plan   Plans to continue exercise at Home (comment)   walking   Frequency Add 2 additional days to program exercise sessions.    Initial Home Exercises Provided 07/25/22      Oxygen   Maintain Oxygen Saturation 88% or higher             Nutrition:  Target Goals: Understanding of nutrition guidelines, daily intake of sodium <1525m, cholesterol <2013m calories 30% from fat and 7% or less from saturated fats, daily to have 5 or more servings of fruits and vegetables.  Education: All About Nutrition: -Group instruction provided by verbal, written material, interactive activities, discussions, models, and posters to present general guidelines for heart healthy nutrition including fat, fiber, MyPlate, the role of sodium in heart healthy nutrition, utilization of the nutrition label, and utilization of this knowledge for meal planning. Follow up email sent as well. Written material given at graduation. [facility]  Education need identified 07/04/22       Biometrics:  Pre Biometrics - 07/04/22 1238       Pre Biometrics   Height 5' 11.5" (1.816 m)    Weight 183 lb 14.4 oz (83.4 kg)    Waist Circumference 39 inches    Hip Circumference  41 inches    Waist to Hip Ratio 0.95 %    BMI (Calculated) 25.29    Single Leg Stand 30 seconds              Nutrition Therapy Plan and  Nutrition Goals:  Nutrition Therapy & Goals - 07/04/22 1226       Intervention Plan   Intervention Prescribe, educate and counsel regarding individualized specific dietary modifications aiming towards targeted core components such as weight, hypertension, lipid management, diabetes, heart failure and other comorbidities.    Expected Outcomes Short Term Goal: Understand basic principles of dietary content, such as calories, fat, sodium, cholesterol and nutrients.;Short Term Goal: A plan has been developed with personal nutrition goals set during dietitian appointment.;Long Term Goal: Adherence to prescribed nutrition plan.             Nutrition Assessments:  MEDIFICTS Score Key: ?70 Need to make dietary changes  40-70 Heart Healthy Diet ? 40 Therapeutic Level Cholesterol Diet  Flowsheet Row Cardiac Rehab from 07/04/2022 in [facility] Cardiac and Pulmonary Rehab  Picture Your Plate Total Score on Admission 37      Picture Your Plate Scores: <56 Unhealthy dietary pattern with much room for improvement. 41-50 Dietary pattern unlikely to meet recommendations for good health and room for improvement. 51-60 More healthful dietary pattern, with some room for improvement.  >60 Healthy dietary pattern, although there may be some specific behaviors that could be improved.    Nutrition Goals Re-Evaluation:  Nutrition Goals Re-Evaluation     [place] Name 07/25/22 1400             Goals   Nutrition Goal Garrett Park does not have an appointment with the RD but will need one scheduled.       Comment Short: Make appt with RD Long: Follow recommendations by RD                Nutrition Goals Discharge (Final Nutrition Goals Re-Evaluation):  Nutrition Goals Re-Evaluation - 07/25/22 1400       Goals   Nutrition Goal Dean does not have an appointment with the RD but will need one scheduled.    Comment Short: Make appt with RD Long: Follow recommendations by RD              Psychosocial: Target Goals: Acknowledge presence or absence of significant depression and/or stress, maximize coping skills, provide positive support system. Participant is able to verbalize types and ability to use techniques and skills needed for reducing stress and depression.   Education: Stress, Anxiety, and Depression - Group verbal and visual presentation to define topics covered.  Reviews how body is impacted by stress, anxiety, and depression.  Also discusses healthy ways to reduce stress and to treat/manage anxiety and depression.  Written material given at graduation.   Education: Sleep Hygiene -Provides group verbal and written instruction about how sleep can affect your health.  Define sleep hygiene, discuss sleep cycles and impact of sleep habits. Review good sleep hygiene tips.    Initial Review & Psychosocial Screening:  Initial Psych Review & Screening - 06/26/22 0912       Initial Review   Current issues with None Identified      Family Dynamics   Good Support System? Yes   wife,neice and her child, neighbors, family     Barriers   Psychosocial barriers to participate in program There are no identifiable barriers or psychosocial needs.      Screening Interventions  Interventions Encouraged to exercise;To provide support and resources with identified psychosocial needs;Provide feedback about the scores to participant    Expected Outcomes Short Term goal: Utilizing psychosocial counselor, staff and physician to assist with identification of specific Stressors or current issues interfering with healing process. Setting desired goal for each stressor or current issue identified.;Long Term Goal: Stressors or current issues are controlled or eliminated.;Short Term goal: Identification and review with participant of any Quality of Life or Depression concerns found by scoring the questionnaire.;Long Term goal: The participant improves quality of Life and PHQ9 Scores as seen  by post scores and/or verbalization of changes             Quality of Life Scores:   Quality of Life - 07/04/22 1232       Quality of Life   Select Quality of Life      Quality of Life Scores   Health/Function Pre 20.83 %    Socioeconomic Pre 27.43 %    Psych/Spiritual Pre 28.29 %    Family Pre 28.8 %    GLOBAL Pre 24.9 %            Scores of 19 and below usually indicate a poorer quality of life in these areas.  A difference of  2-3 points is a clinically meaningful difference.  A difference of 2-3 points in the total score of the Quality of Life Index has been associated with significant improvement in overall quality of life, self-image, physical symptoms, and general health in studies assessing change in quality of life.  PHQ-9: Review Flowsheet        No data to display         Interpretation of Total Score  Total Score Depression Severity:  1-4 = Minimal depression, 5-9 = Mild depression, 10-14 = Moderate depression, 15-19 = Moderately severe depression, 20-27 = Severe depression   Psychosocial Evaluation and Intervention:  Psychosocial Evaluation - 06/26/22 0923       Psychosocial Evaluation & Interventions   Interventions Encouraged to exercise with the program and follow exercise prescription    Comments Jimmy has no barriers to attending the program. He wants to learn all he can to know what to expect. He lives with his wife. He has 2 grown children. His wife , family, neighbors and friends are his support.  He is ready to get started.    Expected Outcomes STG Jaxxson attends all scheduled sessions, he is able to progress with his exercise,despite his back problems. LTG Darren is able to continue his progression after discharge    Continue Psychosocial Services  Follow up required by staff             Psychosocial Re-Evaluation:  Psychosocial Re-Evaluation     [place] Name 07/25/22 1406             Psychosocial Re-Evaluation   Current issues  with None Identified       Comments Garrett Park is doing well mentally. His wife and kids are good support. His wife just broke her foot and they are busy running her to appointments. He is not taking any medications for depression/ anxiety nor feels that he needs to. There are no big stressors in his life. Denies problem with sleep. He is waiting to get cleared to go back to work which he thinks will be in the next week or 2. He enjoys playing golf and can't wait to get back to that as soon as he is cleared from  a sternal precaution standpoint       Expected Outcomes Short: Continue attendning rehab consistently Long: Continue to maintain positive attitude       Interventions Encouraged to attend Cardiac Rehabilitation for the exercise       Continue Psychosocial Services  Follow up required by staff                Psychosocial Discharge (Final Psychosocial Re-Evaluation):  Psychosocial Re-Evaluation - 07/25/22 1406       Psychosocial Re-Evaluation   Current issues with None Identified    Comments Garrett Park is doing well mentally. His wife and kids are good support. His wife just broke her foot and they are busy running her to appointments. He is not taking any medications for depression/ anxiety nor feels that he needs to. There are no big stressors in his life. Denies problem with sleep. He is waiting to get cleared to go back to work which he thinks will be in the next week or 2. He enjoys playing golf and can't wait to get back to that as soon as he is cleared from a sternal precaution standpoint    Expected Outcomes Short: Continue attendning rehab consistently Long: Continue to maintain positive attitude    Interventions Encouraged to attend Cardiac Rehabilitation for the exercise    Continue Psychosocial Services  Follow up required by staff             Vocational Rehabilitation: Provide vocational rehab assistance to qualifying candidates.   Vocational Rehab Evaluation &  Intervention:   Education: Education Goals: Education classes will be provided on a variety of topics geared toward better understanding of heart health and risk factor modification. Participant will state understanding/return demonstration of topics presented as noted by education test scores.  Learning Barriers/Preferences:   General Cardiac Education Topics:  AED/CPR: - Group verbal and written instruction with the use of models to demonstrate the basic use of the AED with the basic ABC's of resuscitation.   Anatomy and Cardiac Procedures: - Group verbal and visual presentation and models provide information about basic cardiac anatomy and function. Reviews the testing methods done to diagnose heart disease and the outcomes of the test results. Describes the treatment choices: Medical Management, Angioplasty, or Coronary Bypass Surgery for treating various heart conditions including Myocardial Infarction, Angina, Valve Disease, and Cardiac Arrhythmias.  Written material given at graduation. Flowsheet Row Cardiac Rehab from 07/18/2022 in [facility] Cardiac and Pulmonary Rehab  Education need identified 07/04/22  Date 07/18/22  Educator SB  Instruction Review Code 1- Verbalizes Understanding       Medication Safety: - Group verbal and visual instruction to review commonly prescribed medications for heart and lung disease. Reviews the medication, class of the drug, and side effects. Includes the steps to properly store meds and maintain the prescription regimen.  Written material given at graduation.   Intimacy: - Group verbal instruction through game format to discuss how heart and lung disease can affect sexual intimacy. Written material given at graduation..   Know Your Numbers and Heart Failure: - Group verbal and visual instruction to discuss disease risk factors for cardiac and pulmonary disease and treatment options.  Reviews associated critical values for Overweight/Obesity,  Hypertension, Cholesterol, and Diabetes.  Discusses basics of heart failure: signs/symptoms and treatments.  Introduces Heart Failure Zone chart for action plan for heart failure.  Written material given at graduation.   Infection Prevention: - Provides verbal and written material to individual with discussion of infection  control including proper hand washing and proper equipment cleaning during exercise session. Flowsheet Row Cardiac Rehab from 07/18/2022 in [facility] Cardiac and Pulmonary Rehab  Education need identified 07/04/22  Date 07/04/22  Educator [place]  Instruction Review Code 1- Verbalizes Understanding       Falls Prevention: - Provides verbal and written material to individual with discussion of falls prevention and safety. Flowsheet Row Cardiac Rehab from 06/26/2022 in [facility] Cardiac and Pulmonary Rehab  Date 06/26/22  Educator SB  Instruction Review Code 1- Verbalizes Understanding       Other: -Provides group and verbal instruction on various topics (see comments)   Knowledge Questionnaire Score:  Knowledge Questionnaire Score - 07/04/22 1226       Knowledge Questionnaire Score   Pre Score 24/26             Core Components/Risk Factors/Patient Goals at Admission:  Personal Goals and Risk Factors at Admission - 07/04/22 1257       Core Components/Risk Factors/Patient Goals on Admission    Weight Management Yes;Weight Maintenance    Intervention Weight Management: Develop a combined nutrition and exercise program designed to reach desired caloric intake, while maintaining appropriate intake of nutrient and fiber, sodium and fats, and appropriate energy expenditure required for the weight goal.;Weight Management/Obesity: Establish reasonable short term and long term weight goals.;Weight Management: Provide education and appropriate resources to help participant work on and attain dietary goals.    Admit Weight 183 lb (83 kg)    Goal Weight: Short Term 183 lb (83 kg)     Goal Weight: Long Term 183 lb (83 kg)    Expected Outcomes Short Term: Continue to assess and modify interventions until short term weight is achieved;Long Term: Adherence to nutrition and physical activity/exercise program aimed toward attainment of established weight goal;Weight Maintenance: Understanding of the daily nutrition guidelines, which includes 25-35% calories from fat, 7% or less cal from saturated fats, less than 287m cholesterol, less than 1.5gm of sodium, & 5 or more servings of fruits and vegetables daily;Understanding recommendations for meals to include 15-35% energy as protein, 25-35% energy from fat, 35-60% energy from carbohydrates, less than 2037mof dietary cholesterol, 20-35 gm of total fiber daily;Understanding of distribution of calorie intake throughout the day with the consumption of 4-5 meals/snacks    Tobacco Cessation Yes    Number of packs per day Merland is a current tobacco user. Intervention for tobacco cessation was provided at the initial medical review. He was asked about readiness to quit and reported that he has decreased number of cigarettes since surgery. Was at 1 1/4 pack a day. Is currently at 5-7 cigarettes a day, sometimes 10, and is planning to decrease to zero. He is not using anything at this time to help him. He does not have a set quit date . Patient was advised and educated about tobacco cessation using combination therapy, tobacco cessation classes, quit line, and quit smoking apps. Patient demonstrated understanding of this material. Staff will continue to provide encouragement and follow up with the patient throughout the program.    Intervention Assist the participant in steps to quit. Provide individualized education and counseling about committing to Tobacco Cessation, relapse prevention, and pharmacological support that can be provided by physician.;OfAdvice workerassist with locating and accessing local/national Quit Smoking  programs, and support quit date choice.    Expected Outcomes Short Term: Will demonstrate readiness to quit, by selecting a quit date.;Short Term: Will quit all tobacco product use, adhering  to prevention of relapse plan.;Long Term: Complete abstinence from all tobacco products for at least 12 months from quit date.    Lipids Yes    Intervention Provide education and support for participant on nutrition & aerobic/resistive exercise along with prescribed medications to achieve LDL <28m, HDL >449m    Expected Outcomes Short Term: Participant states understanding of desired cholesterol values and is compliant with medications prescribed. Participant is following exercise prescription and nutrition guidelines.;Long Term: Cholesterol controlled with medications as prescribed, with individualized exercise RX and with personalized nutrition plan. Value goals: LDL < 7069mHDL > 40 mg.             Education:Diabetes - Individual verbal and written instruction to review signs/symptoms of diabetes, desired ranges of glucose level fasting, after meals and with exercise. Acknowledge that pre and post exercise glucose checks will be done for 3 sessions at entry of program.   Core Components/Risk Factors/Patient Goals Review:   Goals and Risk Factor Review     Row Name 07/25/22 1401             Core Components/Risk Factors/Patient Goals Review   Personal Goals Review Weight Management/Obesity;Tobacco Cessation       Review [place] looking to maintain his weight. He had already lost 10lbs since his heart event but thinks he's gaining it slowly back. He is aware to be mindful of sudden weight gain in a short period of time. He is still smoking 8-10 cigarettes/ day but is still wanting to quit. He is not interested in trying the nicotine patches or gum. We talked about using fake ciagerettes to help with his oral fixation. He has not quite set a quit date but was encouraged to do so. We hope to see his #  of cigarettes overtime start to decrease       Expected Outcomes Short: Work on smoking cessation Long: Continue long-term abstinence of tobacco                Core Components/Risk Factors/Patient Goals at Discharge (Final Review):   Goals and Risk Factor Review - 07/25/22 1401       Core Components/Risk Factors/Patient Goals Review   Personal Goals Review Weight Management/Obesity;Tobacco Cessation    Review [place] looking to maintain his weight. He had already lost 10lbs since his heart event but thinks he's gaining it slowly back. He is aware to be mindful of sudden weight gain in a short period of time. He is still smoking 8-10 cigarettes/ day but is still wanting to quit. He is not interested in trying the nicotine patches or gum. We talked about using fake ciagerettes to help with his oral fixation. He has not quite set a quit date but was encouraged to do so. We hope to see his # of cigarettes overtime start to decrease    Expected Outcomes Short: Work on smoking cessation Long: Continue long-term abstinence of tobacco             ITP Comments:  ITP Comments     Row Name 06/26/22 0922 06/26/22 0932 07/04/22 1222 07/09/22 1340 08/01/22 0709   ITP Comments Virtual orientation call completed today. he has an appointment on Date: 07/04/2022  for EP eval and gym Orientation.  [facility] . Merek is a current tobacco user. Intervention for tobacco cessation was provided at the initial medical review. He was asked about readiness to quit and reported that he  has decreased number of cigarettes since surgery. Was at 1 1/4 pack a day. Is currently at 5-7 cigarettes a day and is planning to decrease to zero. He does not have a set quit date . Patient was advised and educated about tobacco cessation using combination therapy, tobacco cessation classes, quit line, and quit smoking apps. Patient demonstrated understanding of this material.  Staff will continue to provide encouragement and follow up with the patient throughout the program. Completed 6MWT and gym orientation. Initial ITP created and sent for review to Dr. Emily Filbert, Medical Director. First full day of exercise!  Patient was oriented to gym and equipment including functions, settings, policies, and procedures.  Patient's individual exercise prescription and treatment plan were reviewed.  All starting workloads were established based on the results of the 6 minute walk test done at initial orientation visit.  The plan for exercise progression was also introduced and progression will be customized based on patient's performance and goals. 30 Day review completed. Medical Director ITP review done, changes made as directed, and signed approval by Medical Director.   New to program            Comments:

## 2022-08-01 NOTE — Progress Notes (Signed)
Daily Session Note  Patient Details  Name: RHYLAND HINDERLITER MRN: [id number] Date of Birth: 30-Apr-1966 Referring Provider:   Flowsheet Row Cardiac Rehab from 07/04/2022 in [facility] Cardiac and Pulmonary Rehab  Referring Provider Serafina Royals MD       Encounter Date: 08/01/2022  Check In:  Session Check In - 08/01/22 1354       Check-In   Supervising physician immediately available to respond to emergencies See telemetry face sheet for immediately available ER MD    Location [facility]-Cardiac & Pulmonary Rehab    Staff Present Alberteen Sam, MA, RCEP, CCRP, Mindi Curling, RN, ADN;Johnross Nabozny Sherryll Burger, RN BSN    Virtual Visit No    Medication changes reported     No    Fall or balance concerns reported    No    Tobacco Cessation No Change    Current number of cigarettes/nicotine per day     10    Warm-up and Cool-down Performed on first and last piece of equipment    Resistance Training Performed Yes    VAD Patient? No    PAD/SET Patient? No      Pain Assessment   Currently in Pain? No/denies                Social History   Tobacco Use  Smoking Status Every Day   Packs/day: 1.00   Years: 40.00   Total pack years: 40.00   Types: Cigarettes  Smokeless Tobacco Never  Tobacco Comments   IS working on quitting. Down to 5-7 cigarettes a day . 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   No quit date set.     Goals Met:  Independence with exercise equipment Exercise tolerated well No report of concerns or symptoms today Strength training completed today  Goals Unmet:  Not Applicable  Comments: Pt able to follow exercise prescription today without complaint.  Will continue to monitor for progression.    Dr. Emily Filbert is Medical Director for [place].  Dr. Ottie Glazier is Medical Director for [facility] Pulmonary Rehabilitation.

## 2022-08-02 ENCOUNTER — Encounter: Payer: BC Managed Care – PPO | Admitting: *Deleted

## 2022-08-02 DIAGNOSIS — Z48812 Encounter for surgical aftercare following surgery on the circulatory system: Secondary | ICD-10-CM | POA: Diagnosis not present

## 2022-08-02 DIAGNOSIS — Z951 Presence of aortocoronary bypass graft: Secondary | ICD-10-CM | POA: Diagnosis not present

## 2022-08-02 DIAGNOSIS — I214 Non-ST elevation (NSTEMI) myocardial infarction: Secondary | ICD-10-CM

## 2022-08-02 NOTE — Progress Notes (Signed)
Daily Session Note  Patient Details  Name: JEOVANNY CUADROS MRN: [id number] Date of Birth: 03/04/1966 Referring Provider:   Flowsheet Row Cardiac Rehab from 07/04/2022 in [facility] Cardiac and Pulmonary Rehab  Referring Provider Serafina Royals MD       Encounter Date: 08/02/2022  Check In:  Session Check In - 08/02/22 1412       Check-In   Supervising physician immediately available to respond to emergencies See telemetry face sheet for immediately available ER MD    Location [facility]-Cardiac & Pulmonary Rehab    Staff Present Heath Lark, RN, BSN, CCRP;Noah Tickle, BS, Exercise Physiologist;Joseph [place], [place]    Virtual Visit No    Medication changes reported     No    Fall or balance concerns reported    No    Warm-up and Cool-down Performed on first and last piece of equipment    Resistance Training Performed Yes    VAD Patient? No    PAD/SET Patient? No      Pain Assessment   Currently in Pain? No/denies                Social History   Tobacco Use  Smoking Status Every Day   Packs/day: 1.00   Years: 40.00   Total pack years: 40.00   Types: Cigarettes  Smokeless Tobacco Never  Tobacco Comments   IS working on quitting. Down to 5-7 cigarettes a day . 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   No quit date set.     Goals Met:  Independence with exercise equipment Exercise tolerated well No report of concerns or symptoms today  Goals Unmet:  Not Applicable  Comments: Pt able to follow exercise prescription today without complaint.  Will continue to monitor for progression.    Dr. Emily Filbert is Medical Director for [place].  Dr. Ottie Glazier is Medical Director for [facility] Pulmonary Rehabilitation.

## 2022-08-06 ENCOUNTER — Encounter: Payer: BC Managed Care – PPO | Admitting: *Deleted

## 2022-08-06 DIAGNOSIS — Z951 Presence of aortocoronary bypass graft: Secondary | ICD-10-CM

## 2022-08-06 DIAGNOSIS — Z48812 Encounter for surgical aftercare following surgery on the circulatory system: Secondary | ICD-10-CM | POA: Diagnosis not present

## 2022-08-06 DIAGNOSIS — I214 Non-ST elevation (NSTEMI) myocardial infarction: Secondary | ICD-10-CM | POA: Diagnosis not present

## 2022-08-06 NOTE — Progress Notes (Signed)
Daily Session Note  Patient Details  Name: DIVANTE KOTCH MRN: [id number] Date of Birth: 1965/12/17 Referring Provider:   Flowsheet Row Cardiac Rehab from 07/04/2022 in [facility] Cardiac and Pulmonary Rehab  Referring Provider Serafina Royals MD       Encounter Date: 08/06/2022  Check In:  Session Check In - 08/06/22 1341       Check-In   Supervising physician immediately available to respond to emergencies See telemetry face sheet for immediately available ER MD    Location [facility]-Cardiac & Pulmonary Rehab    Staff Present Justin Mend, RCP,RRT,BSRT;Amorie Rentz Sherryll Burger, RN Odelia Gage, RN, ADN    Virtual Visit No    Medication changes reported     No    Fall or balance concerns reported    No    Tobacco Cessation Use Decreased    Current number of cigarettes/nicotine per day     8    Warm-up and Cool-down Performed on first and last piece of equipment    Resistance Training Performed Yes    VAD Patient? No    PAD/SET Patient? No      Pain Assessment   Currently in Pain? No/denies                Social History   Tobacco Use  Smoking Status Every Day   Packs/day: 1.00   Years: 40.00   Total pack years: 40.00   Types: Cigarettes  Smokeless Tobacco Never  Tobacco Comments   IS working on quitting. Down to 5-7 cigarettes a day . 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   No quit date set.     Goals Met:  Independence with exercise equipment Exercise tolerated well No report of concerns or symptoms today Strength training completed today  Goals Unmet:  Not Applicable  Comments: Pt able to follow exercise prescription today without complaint.  Will continue to monitor for progression.    Dr. Emily Filbert is Medical Director for [place].  Dr. Ottie Glazier is Medical Director for [facility] Pulmonary Rehabilitation.

## 2022-08-06 NOTE — Progress Notes (Signed)
Completed initial RD consultation ?

## 2022-08-09 ENCOUNTER — Encounter: Payer: BC Managed Care – PPO | Admitting: *Deleted

## 2022-08-09 DIAGNOSIS — Z951 Presence of aortocoronary bypass graft: Secondary | ICD-10-CM | POA: Diagnosis not present

## 2022-08-09 DIAGNOSIS — I214 Non-ST elevation (NSTEMI) myocardial infarction: Secondary | ICD-10-CM | POA: Diagnosis not present

## 2022-08-09 DIAGNOSIS — Z48812 Encounter for surgical aftercare following surgery on the circulatory system: Secondary | ICD-10-CM | POA: Diagnosis not present

## 2022-08-09 NOTE — Progress Notes (Signed)
Daily Session Note  Patient Details  Name: Garrett Park MRN: [id number] Date of Birth: 10/13/1966 Referring Provider:   Flowsheet Row Cardiac Rehab from 07/04/2022 in [facility] Cardiac and Pulmonary Rehab  Referring Provider Serafina Royals MD       Encounter Date: 08/09/2022  Check In:  Session Check In - 08/09/22 1401       Check-In   Supervising physician immediately available to respond to emergencies See telemetry face sheet for immediately available ER MD    Location [facility]-Cardiac & Pulmonary Rehab    Staff Present Antionette Fairy, BS, Exercise Physiologist;Jessica [place], MA, [place], CCRP, Mindi Curling, RN, [place]    Virtual Visit Yes    Medication changes reported     No    Fall or balance concerns reported    No    Tobacco Cessation No Change    Warm-up and Cool-down Performed on first and last piece of equipment    Resistance Training Performed Yes    VAD Patient? No    PAD/SET Patient? No      Pain Assessment   Currently in Pain? No/denies                Social History   Tobacco Use  Smoking Status Every Day   Packs/day: 1.00   Years: 40.00   Total pack years: 40.00   Types: Cigarettes  Smokeless Tobacco Never  Tobacco Comments   IS working on quitting. Down to 5-7 cigarettes a day . 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   No quit date set.     Goals Met:  Independence with exercise equipment Exercise tolerated well No report of concerns or symptoms today Strength training completed today  Goals Unmet:  Not Applicable  Comments: Pt able to follow exercise prescription today without complaint.  Will continue to monitor for progression.    Dr. Emily Filbert is Medical Director for [place].  Dr. Ottie Glazier is Medical Director for [facility] Pulmonary Rehabilitation.

## 2022-08-13 ENCOUNTER — Encounter: Payer: BC Managed Care – PPO | Admitting: *Deleted

## 2022-08-13 DIAGNOSIS — Z951 Presence of aortocoronary bypass graft: Secondary | ICD-10-CM | POA: Diagnosis not present

## 2022-08-13 DIAGNOSIS — I214 Non-ST elevation (NSTEMI) myocardial infarction: Secondary | ICD-10-CM

## 2022-08-13 DIAGNOSIS — Z48812 Encounter for surgical aftercare following surgery on the circulatory system: Secondary | ICD-10-CM | POA: Diagnosis not present

## 2022-08-13 NOTE — Progress Notes (Signed)
Daily Session Note  Patient Details  Name: Garrett Park MRN: [id number] Date of Birth: [birth year] Referring Provider:   Flowsheet Row Cardiac Rehab from 07/04/2022 in [facility] Cardiac and Pulmonary Rehab  Referring Provider Serafina Royals MD       Encounter Date: 08/13/2022  Check In:  Session Check In - 08/13/22 1331       Check-In   Supervising physician immediately available to respond to emergencies See telemetry face sheet for immediately available ER MD    Location [facility]-Cardiac & Pulmonary Rehab    Staff Present Renita Papa, RN Odelia Gage, RN, Terie Purser, RCP,RRT,BSRT    Virtual Visit No    Medication changes reported     No    Fall or balance concerns reported    No    Tobacco Cessation No Change    Current number of cigarettes/nicotine per day     8    Warm-up and Cool-down Performed on first and last piece of equipment    Resistance Training Performed Yes    VAD Patient? No    PAD/SET Patient? No      Pain Assessment   Currently in Pain? No/denies                Social History   Tobacco Use  Smoking Status Every Day   Packs/day: 1.00   Years: 40.00   Total pack years: 40.00   Types: Cigarettes  Smokeless Tobacco Never  Tobacco Comments   IS working on quitting. Down to 5-7 cigarettes a day . 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   No quit date set.     Goals Met:  Independence with exercise equipment Exercise tolerated well No report of concerns or symptoms today Strength training completed today  Goals Unmet:  Not Applicable  Comments: Pt able to follow exercise prescription today without complaint.  Will continue to monitor for progression.    Dr. Emily Filbert is Medical Director for [place].  Dr. Ottie Glazier is Medical Director for [facility] Pulmonary Rehabilitation.

## 2022-08-15 ENCOUNTER — Encounter: Payer: BC Managed Care – PPO | Admitting: *Deleted

## 2022-08-15 DIAGNOSIS — Z951 Presence of aortocoronary bypass graft: Secondary | ICD-10-CM

## 2022-08-15 DIAGNOSIS — I214 Non-ST elevation (NSTEMI) myocardial infarction: Secondary | ICD-10-CM

## 2022-08-15 DIAGNOSIS — Z48812 Encounter for surgical aftercare following surgery on the circulatory system: Secondary | ICD-10-CM | POA: Diagnosis not present

## 2022-08-15 NOTE — Progress Notes (Signed)
Daily Session Note  Patient Details  Name: MATTIE NOVOSEL MRN: [id number] Date of Birth: 30-Sep-1966 Referring Provider:   Flowsheet Row Cardiac Rehab from 07/04/2022 in [facility] Cardiac and Pulmonary Rehab  Referring Provider Serafina Royals MD       Encounter Date: 08/15/2022  Check In:  Session Check In - 08/15/22 1346       Check-In   Supervising physician immediately available to respond to emergencies See telemetry face sheet for immediately available ER MD    Location [facility]-Cardiac & Pulmonary Rehab    Staff Present Renita Papa, RN Odelia Gage, RN, [place]hel[place]ia Blase, MS, ASCM CEP, Exercise Physiologist;Kelly Amedeo Plenty, BS, ACSM CEP, Exercise Physiologist    Virtual Visit No    Medication changes reported     No    Fall or balance concerns reported    No    Tobacco Cessation No Change    Warm-up and Cool-down Performed on first and last piece of equipment    Resistance Training Performed Yes    VAD Patient? No    PAD/SET Patient? No      Pain Assessment   Currently in Pain? No/denies                Social History   Tobacco Use  Smoking Status Every Day   Packs/day: 1.00   Years: 40.00   Total pack years: 40.00   Types: Cigarettes  Smokeless Tobacco Never  Tobacco Comments   IS working on quitting. Down to 5-7 cigarettes a day . 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   No quit date set.     Goals Met:  Independence with exercise equipment Exercise tolerated well No report of concerns or symptoms today Strength training completed today  Goals Unmet:  Not Applicable  Comments: Pt able to follow exercise prescription today without complaint.  Will continue to monitor for progression.    Dr. Emily Filbert is Medical Director for [place].  Dr. Ottie Glazier is Medical Director for [facility] Pulmonary Rehabilitation.

## 2022-08-16 ENCOUNTER — Encounter: Payer: BC Managed Care – PPO | Admitting: *Deleted

## 2022-08-16 DIAGNOSIS — G4733 Obstructive sleep apnea (adult) (pediatric): Secondary | ICD-10-CM | POA: Diagnosis not present

## 2022-08-16 DIAGNOSIS — I214 Non-ST elevation (NSTEMI) myocardial infarction: Secondary | ICD-10-CM | POA: Diagnosis not present

## 2022-08-16 DIAGNOSIS — Z951 Presence of aortocoronary bypass graft: Secondary | ICD-10-CM | POA: Diagnosis not present

## 2022-08-16 DIAGNOSIS — I25810 Atherosclerosis of coronary artery bypass graft(s) without angina pectoris: Secondary | ICD-10-CM | POA: Diagnosis not present

## 2022-08-16 DIAGNOSIS — Z48812 Encounter for surgical aftercare following surgery on the circulatory system: Secondary | ICD-10-CM | POA: Diagnosis not present

## 2022-08-16 NOTE — Progress Notes (Signed)
Daily Session Note  Patient Details  Name: Garrett Park MRN: [id number] Date of Birth: 13-Jul-1966 Referring Provider:   Flowsheet Row Cardiac Rehab from 07/04/2022 in [facility] Cardiac and Pulmonary Rehab  Referring Provider Serafina Royals MD       Encounter Date: 08/16/2022  Check In:  Session Check In - 08/16/22 1341       Check-In   Supervising physician immediately available to respond to emergencies See telemetry face sheet for immediately available ER MD    Location [facility]-Cardiac & Pulmonary Rehab    Staff Present Renita Papa, RN BSN;Jessica Tazlina, MA, RCEP, CCRP, CCET;Joseph [place], [place]    Virtual Visit No    Medication changes reported     No    Fall or balance concerns reported    No    Tobacco Cessation No Change    Current number of cigarettes/nicotine per day     8    Warm-up and Cool-down Performed on first and last piece of equipment    Resistance Training Performed Yes    VAD Patient? No    PAD/SET Patient? No      Pain Assessment   Currently in Pain? No/denies                Social History   Tobacco Use  Smoking Status Every Day   Packs/day: 1.00   Years: 40.00   Total pack years: 40.00   Types: Cigarettes  Smokeless Tobacco Never  Tobacco Comments   IS working on quitting. Down to 5-7 cigarettes a day . 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   No quit date set.     Goals Met:  Independence with exercise equipment Exercise tolerated well No report of concerns or symptoms today Strength training completed today  Goals Unmet:  Not Applicable  Comments: Pt able to follow exercise prescription today without complaint.  Will continue to monitor for progression.    Dr. Emily Filbert is Medical Director for .  Dr. Ottie Glazier is Medical Director for [facility] Pulmonary Rehabilitation.

## 2022-08-20 ENCOUNTER — Encounter: Payer: BC Managed Care – PPO | Admitting: *Deleted

## 2022-08-20 DIAGNOSIS — I214 Non-ST elevation (NSTEMI) myocardial infarction: Secondary | ICD-10-CM | POA: Diagnosis not present

## 2022-08-20 DIAGNOSIS — Z951 Presence of aortocoronary bypass graft: Secondary | ICD-10-CM | POA: Diagnosis not present

## 2022-08-20 DIAGNOSIS — Z48812 Encounter for surgical aftercare following surgery on the circulatory system: Secondary | ICD-10-CM | POA: Diagnosis not present

## 2022-08-20 NOTE — Progress Notes (Signed)
Daily Session Note  Patient Details  Name: Garrett Park MRN: [id number] Date of Birth: 12/23/1965 Referring Provider:   Flowsheet Row Cardiac Rehab from 07/04/2022 in [facility] Cardiac and Pulmonary Rehab  Referring Provider Serafina Royals MD       Encounter Date: 08/20/2022  Check In:  Session Check In - 08/20/22 1335       Check-In   Supervising physician immediately available to respond to emergencies See telemetry face sheet for immediately available ER MD    Location [facility]-Cardiac & Pulmonary Rehab    Staff Present Alberteen Sam, MA, RCEP, CCRP, CCET;[employer], [place], RN, [place]    Virtual Visit No    Medication changes reported     No    Fall or balance concerns reported    No    Warm-up and Cool-down Performed on first and last piece of equipment    Resistance Training Performed Yes    VAD Patient? No    PAD/SET Patient? No      Pain Assessment   Currently in Pain? No/denies                Social History   Tobacco Use  Smoking Status Every Day   Packs/day: 1.00   Years: 40.00   Total pack years: 40.00   Types: Cigarettes  Smokeless Tobacco Never  Tobacco Comments   IS working on quitting. Down to 5-7 cigarettes a day . 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   No quit date set.     Goals Met:  Independence with exercise equipment Exercise tolerated well No report of concerns or symptoms today Strength training completed today  Goals Unmet:  Not Applicable  Comments: Pt able to follow exercise prescription today without complaint.  Will continue to monitor for progression.    Dr. Emily Filbert is Medical Director for [place].  Dr. Ottie Glazier is Medical Director for [facility].

## 2022-08-22 ENCOUNTER — Encounter: Payer: BC Managed Care – PPO | Admitting: *Deleted

## 2022-08-22 DIAGNOSIS — Z951 Presence of aortocoronary bypass graft: Secondary | ICD-10-CM

## 2022-08-22 DIAGNOSIS — I214 Non-ST elevation (NSTEMI) myocardial infarction: Secondary | ICD-10-CM | POA: Diagnosis not present

## 2022-08-22 DIAGNOSIS — Z48812 Encounter for surgical aftercare following surgery on the circulatory system: Secondary | ICD-10-CM | POA: Diagnosis not present

## 2022-08-22 NOTE — Progress Notes (Signed)
Daily Session Note  Patient Details  Name: Garrett Park MRN: [id number] Date of Birth: 1966/01/20 Referring Provider:   Flowsheet Row Cardiac Rehab from 07/04/2022 in [facility]iac [facility] Pulmonary Rehab  Referring Provider Serafina Royals MD       Encounter Date: 08/22/2022  Check In:  Session Check In - 08/22/22 1348       Check-In   Supervising physician immediately available to respond to emergencies See telemetry face sheet for immediately available ER MD    Location [facility]Card[facility] Pul[facility]    Staff Present Coralie Keens, MS, ASCM CEP, Exercise Physiologist;Jessica Luan Pulling, MA, RCEP, CCRP, CCET;Kali Deadwyler Corning, RN Odelia Gage, RN, ADN    Virtual Visit No    Medication changes reported     No    Fall or balance concerns reported    No    Tobacco Cessation No Change    Current number of cigarettes/nicotine per day     8    Warm-up and Cool-down Performed on first and last piece of equipment    Resistance Training Performed Yes    VAD Patient? No    PAD/SET Patient? No      Pain Assessment   Currently in Pain? No/denies                Social History   Tobacco Use  Smoking Status Every Day   Packs/day: 1.00   Years: 40.00   Total pack years: 40.00   Types: Cigarettes  Smokeless Tobacco Never  Tobacco Comments   IS working on quitting. Down to 5-7 cigarettes a day . 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   No quit date set.     Goals Met:  Independence with exercise equipment Exercise tolerated well No report of concerns or symptoms today Strength training completed today  Goals Unmet:  Not Applicable  Comments: Pt able to follow exercise prescription today without complaint.  Will continue to monitor for progression.    Dr. Emily Filbert is Medical Director for [place].  Dr. Ottie Glazier is Medical Director for [facility]monary Rehabilitation.

## 2022-08-23 ENCOUNTER — Encounter: Payer: BC Managed Care – PPO | Admitting: *Deleted

## 2022-08-23 DIAGNOSIS — Z48812 Encounter for surgical aftercare following surgery on the circulatory system: Secondary | ICD-10-CM | POA: Diagnosis not present

## 2022-08-23 DIAGNOSIS — Z951 Presence of aortocoronary bypass graft: Secondary | ICD-10-CM | POA: Diagnosis not present

## 2022-08-23 DIAGNOSIS — I214 Non-ST elevation (NSTEMI) myocardial infarction: Secondary | ICD-10-CM

## 2022-08-23 NOTE — Progress Notes (Signed)
Daily Session Note  Patient Details  Name: Garrett Park MRN: [id number] Date of Birth: 08/13/1966 Referring Provider:   Flowsheet Row Cardiac Rehab from 07/04/2022 in [facility] Cardiac and Pulmonary Rehab  Referring Provider Serafina Royals MD       Encounter Date: 08/23/2022  Check In:  Session Check In - 08/23/22 1343       Check-In   Supervising physician immediately available to respond to emergencies See telemetry face sheet for immediately available ER MD    Location [facility]Cardiac & Pulmonary Re[facility]    Staff Present Antionette Fairy, BS, Exercise Physiologist;Joseph Rosebud Poles, RN, [place]    Virtual Visit No    Medication changes reported     No    Fall or balance concerns reported    No    Tobacco Cessation No Change    Warm-up and Cool-down Performed on first and last piece of equipment    Resistance Training Performed Yes    VAD Patient? No    PAD/SET Patient? No      Pain Assessment   Currently in Pain? No/denies                Social History   Tobacco Use  Smoking Status Every Day   Packs/day: 1.00   Years: 40.00   Total pack years: 40.00   Types: Cigarettes  Smokeless Tobacco Never  Tobacco Comments   IS working on quitting. Down to 5-7 cigarettes a day . 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   No quit date set.     Goals Met:  Independence with exercise equipment Exercise tolerated well No report of concerns or symptoms today Strength training completed today  Goals Unmet:  Not Applicable  Comments: Pt able to follow exercise prescription today without complaint.  Will continue to monitor for progression.    Dr. Emily Filbert is Medical Director for [place].  Dr. Ottie Glazier is Medical Director for [facility] Pulmonary Rehabilitation.

## 2022-08-27 ENCOUNTER — Encounter: Payer: BC Managed Care – PPO | Admitting: *Deleted

## 2022-08-27 DIAGNOSIS — Z951 Presence of aortocoronary bypass graft: Secondary | ICD-10-CM | POA: Diagnosis not present

## 2022-08-27 DIAGNOSIS — I214 Non-ST elevation (NSTEMI) myocardial infarction: Secondary | ICD-10-CM | POA: Diagnosis not present

## 2022-08-27 DIAGNOSIS — Z48812 Encounter for surgical aftercare following surgery on the circulatory system: Secondary | ICD-10-CM | POA: Diagnosis not present

## 2022-08-27 NOTE — Progress Notes (Signed)
Daily Session Note  Patient Details  Name: Garrett Park MRN: [id number] Date of Birth: Nov 28, 1965 Referring Provider:   Flowsheet Row Cardiac Rehab from 07/04/2022 in [facility] Cardiac and Pulmonary Rehab  Referring Provider Serafina Royals MD       Encounter Date: 08/27/2022  Check In:  Session Check In - 08/27/22 1346       Check-In   Supervising physician immediately available to respond to emergencies See telemetry face sheet for immediately available ER MD    Location [facility]Cardiac & Pulmonary Rehab    Staff Present Renita Papa, RN Odelia Gage, RN, Terie Purser, RCP,RRT,BSRT    Virtual Visit No    Medication changes reported     No    Fall or balance concerns reported    No    Tobacco Cessation No Change    Current number of cigarettes/nicotine per day     8    Warm-up and Cool-down Performed on first and last piece of equipment    Resistance Training Performed Yes    VAD Patient? No    PAD/SET Patient? No      Pain Assessment   Currently in Pain? No/denies                Social History   Tobacco Use  Smoking Status Every Day   Packs/day: 1.00   Years: 40.00   Total pack years: 40.00   Types: Cigarettes  Smokeless Tobacco Never  Tobacco Comments   IS working on quitting. Down to 5-7 cigarettes a day . 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   No quit date set.     Goals Met:  Independence with exercise equipment Exercise tolerated well No report of concerns or symptoms today Strength training completed today  Goals Unmet:  Not Applicable  Comments: Pt able to follow exercise prescription today without complaint.  Will continue to monitor for progression.    Dr. Emily Filbert is Medical Director for [place].  Dr. Ottie Glazier is Medical Director for [facility] Pulmonary Rehabilitation.

## 2022-08-29 ENCOUNTER — Encounter: Payer: BC Managed Care – PPO | Attending: Internal Medicine | Admitting: *Deleted

## 2022-08-29 ENCOUNTER — Encounter: Payer: Self-pay | Admitting: *Deleted

## 2022-08-29 DIAGNOSIS — Z48812 Encounter for surgical aftercare following surgery on the circulatory system: Secondary | ICD-10-CM | POA: Diagnosis not present

## 2022-08-29 DIAGNOSIS — I214 Non-ST elevation (NSTEMI) myocardial infarction: Secondary | ICD-10-CM | POA: Diagnosis not present

## 2022-08-29 DIAGNOSIS — I252 Old myocardial infarction: Secondary | ICD-10-CM | POA: Insufficient documentation

## 2022-08-29 DIAGNOSIS — Z951 Presence of aortocoronary bypass graft: Secondary | ICD-10-CM

## 2022-08-29 NOTE — Progress Notes (Signed)
Daily Session Note  Patient Details  Name: Garrett Park MRN: [id number] Date of Birth: Aug 31, 1966 Referring Provider:   Flowsheet Row Cardiac Rehab from 07/04/2022 in [facility] Cardiac and Pulmonary Rehab  Referring Provider Serafina Royals MD       Encounter Date: 08/29/2022  Check In:  Session Check In - 08/29/22 1336       Check-In   Supervising physician immediately available to respond to emergencies See telemetry face sheet for immediately available ER MD    Location [facility]-Cardiac & Pulmonary Rehab    Staff Present Alberteen Sam, MA, RCEP, CCRP, CCET;Desere Gwin [place], RN Odelia Gage, RN, ADN    Virtual Visit No    Medication changes reported     No    Fall or balance concerns reported    No    Tobacco Cessation No Change    Current number of cigarettes/nicotine per day     8    Warm-up and Cool-down Performed on first and last piece of equipment    Resistance Training Performed Yes    VAD Patient? No    PAD/SET Patient? No      Pain Assessment   Currently in Pain? No/denies                Social History   Tobacco Use  Smoking Status Every Day   Packs/day: 1.00   Years: 40.00   Total pack years: 40.00   Types: Cigarettes  Smokeless Tobacco Never  Tobacco Comments   IS working on quitting. Down to 5-7 cigarettes a day . 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   No quit date set.     Goals Met:  Independence with exercise equipment Exercise tolerated well No report of concerns or symptoms today Strength training completed today  Goals Unmet:  Not Applicable  Comments: Pt able to follow exercise prescription today without complaint.  Will continue to monitor for progression.    Dr. Emily Filbert is Medical Director for [place].  Dr. Ottie Glazier is Medical Director for [facility] Pulmonary Rehabilitation.

## 2022-08-29 NOTE — Progress Notes (Signed)
Cardiac Individual Treatment Plan  Patient Details  Name: Garrett Park MRN: [id number] Date of Birth: 22-Jun-1966 Referring Provider:   Flowsheet Row Cardiac Rehab from 07/04/2022 in [facility] Cardiac and Pulmonary Rehab  Referring Provider Serafina Royals MD       Initial Encounter Date:  Flowsheet Row Cardiac Rehab from 07/04/2022 in [facility] Cardiac and Pulmonary Rehab  Date 07/04/22       Visit Diagnosis: NSTEMI (non-ST elevation myocardial infarction) ([place])  S/P CABG x 1  Patient's Home Medications on Admission:  Current Outpatient Medications:    albuterol (VENTOLIN HFA) 108 (90 Base) MCG/ACT inhaler, , Disp: , Rfl:    ascorbic acid (VITAMIN C) 500 MG tablet, Take by mouth., Disp: , Rfl:    aspirin EC 81 MG tablet, Take by mouth., Disp: , Rfl:    atorvastatin (LIPITOR) 20 MG tablet, Take by mouth., Disp: , Rfl:    clopidogrel (PLAVIX) 75 MG tablet, Take by mouth., Disp: , Rfl:    fluticasone (FLONASE ALLERGY RELIEF) 50 MCG/ACT nasal spray, Place into both nostrils daily as needed for allergies or rhinitis., Disp: , Rfl:    melatonin 5 MG TABS, 1 tablet in the evening Orally Once a day, Disp: , Rfl:    metoprolol tartrate (LOPRESSOR) 25 MG tablet, Take by mouth., Disp: , Rfl:    pantoprazole (PROTONIX) 40 MG tablet, Take by mouth., Disp: , Rfl:   Past Medical History: Past Medical History:  Diagnosis Date   Anxiety yrs ago   Asthma    Chronic low back pain    Dyslipidemia    GERD (gastroesophageal reflux disease)    Heat rash    both arms and chest   Neck pain    OSA (obstructive sleep apnea)    intolerant of cpap   OSA and COPD overlap syndrome ([place]) 01/09/2016   uses online detal device occ   Peyronie disease     Tobacco Use: Social History   Tobacco Use  Smoking Status Every Day   Packs/day: 1.00   Years: 40.00   Total pack years: 40.00   Types: Cigarettes  Smokeless Tobacco Never  Tobacco Comments   IS working on quitting. Down to 5-7 cigarettes a day . Was  1 1/4 pack prior to CABG surgery.   No quit date set.     Labs: Review Flowsheet        No data to display           Exercise Target Goals: Exercise Program Goal: Individual exercise prescription set using results from initial 6 min walk test and THRR while considering  patient's activity barriers and safety.   Exercise Prescription Goal: Initial exercise prescription builds to 30-45 minutes a day of aerobic activity, 2-3 days per week.  Home exercise guidelines will be given to patient during program as part of exercise prescription that the participant will acknowledge.   Education: Aerobic Exercise: - Group verbal and visual presentation on the components of exercise prescription. Introduces F.I.T.T principle from ACSM for exercise prescriptions.  Reviews F.I.T.T. principles of aerobic exercise including progression. Written material given at graduation.   Education: Resistance Exercise: - Group verbal and visual presentation on the components of exercise prescription. Introduces F.I.T.T principle from ACSM for exercise prescriptions  Reviews F.I.T.T. principles of resistance exercise including progression. Written material given at graduation. Flowsheet Row Cardiac Rehab from 08/22/2022 in [facility] Cardiac and Pulmonary Rehab  Date 07/11/22  Educator NT  Instruction Review Code 1- Verbalizes Understanding  Education: Exercise & Equipment Safety: - Individual verbal instruction and demonstration of equipment use and safety with use of the equipment. Flowsheet Row Cardiac Rehab from 08/22/2022 in [facility] Cardiac and Pulmonary Rehab  Education need identified 07/04/22  Date 07/04/22  Educator [place]  Instruction Review Code 1- Verbalizes Understanding       Education: Exercise Physiology & General Exercise Guidelines: - Group verbal and written instruction with models to review the exercise physiology of the cardiovascular system and associated critical values. Provides  general exercise guidelines with specific guidelines to those with heart or lung disease.    Education: Flexibility, Balance, Mind/Body Relaxation: - Group verbal and visual presentation with interactive activity on the components of exercise prescription. Introduces F.I.T.T principle from ACSM for exercise prescriptions. Reviews F.I.T.T. principles of flexibility and balance exercise training including progression. Also discusses the mind body connection.  Reviews various relaxation techniques to help reduce and manage stress (i.e. Deep breathing, progressive muscle relaxation, and visualization). Balance handout provided to take home. Written material given at graduation. Flowsheet Row Cardiac Rehab from 08/22/2022 in [facility] Cardiac and Pulmonary Rehab  Date 07/11/22  Educator NT  Instruction Review Code 1- Verbalizes Understanding       Activity Barriers & Risk Stratification:  Activity Barriers & Cardiac Risk Stratification - 07/04/22 1239       Activity Barriers & Cardiac Risk Stratification   Activity Barriers Back Problems;Joint Problems;Other (comment)    Comments History of fractured hip    Cardiac Risk Stratification High             6 Minute Walk:  6 Minute Walk     Row Name 07/04/22 1253         6 Minute Walk   Phase Initial     Distance 1145 feet     Walk Time 6 minutes     # of Rest Breaks 0     MPH 2.16     METS 3.35     RPE 9     Perceived Dyspnea  0     VO2 Peak 11.75     Symptoms Yes (comment)     Comments Bilateral hip pain 4/10     Resting HR 73 bpm     Resting BP 110/66     Resting Oxygen Saturation  99 %     Exercise Oxygen Saturation  during 6 min walk 100 %     Max Ex. HR 87 bpm     Max Ex. BP 120/64     2 Minute Post BP 112/66              Oxygen Initial Assessment:   Oxygen Re-Evaluation:   Oxygen Discharge (Final Oxygen Re-Evaluation):   Initial Exercise Prescription:  Initial Exercise Prescription - 07/04/22 1200        Date of Initial Exercise RX and Referring Provider   Date 07/04/22    Referring Provider Serafina Royals MD      Oxygen   Maintain Oxygen Saturation 88% or higher      Treadmill   MPH 2.2    Grade 1    Minutes 15    METs 2.99      Recumbant Bike   Level 3    RPM 60    Watts 30    Minutes 15    METs 3.3      REL-XR   Level 2    Speed 50    Minutes 15    METs 3.3  T5 Nustep   Level 2    SPM 80    Minutes 15    METs 3.3      Prescription Details   Frequency (times per week) 3    Duration Progress to 30 minutes of continuous aerobic without signs/symptoms of physical distress      Intensity   THRR 40-80% of Max Heartrate 109 - 145    Ratings of Perceived Exertion 11-13    Perceived Dyspnea 0-4      Progression   Progression Continue to progress workloads to maintain intensity without signs/symptoms of physical distress.      Resistance Training   Training Prescription Yes    Weight 3 lb    Reps 10-15             Perform Capillary Blood Glucose checks as needed.  Exercise Prescription Changes:   Exercise Prescription Changes     Row Name 07/04/22 1200 07/23/22 1400 07/25/22 1500 08/07/22 1100 08/20/22 1300     Response to Exercise   Blood Pressure (Admit) 110/66 128/72 -- 118/68 118/64   Blood Pressure (Exercise) 120/64 140/60 -- 160/76 --   Blood Pressure (Exit) 112/66 122/68 -- 132/64 108/60   Heart Rate (Admit) 83 bpm 70 bpm -- 101 bpm 70 bpm   Heart Rate (Exercise) 87 bpm 115 bpm -- 107 bpm 119 bpm   Heart Rate (Exit) 75 bpm 79 bpm -- 82 bpm 78 bpm   Oxygen Saturation (Admit) 99 % -- -- -- --   Oxygen Saturation (Exercise) 100 % -- -- -- --   Oxygen Saturation (Exit) 99 % -- -- -- --   Rating of Perceived Exertion (Exercise) 9 13 -- 13 12   Perceived Dyspnea (Exercise) 0 -- -- -- --   Symptoms bilateral hip pain 4/10 hip pain 5/10 -- hip pain none   Comments walk test results First full week of exercise -- -- --   Duration -- Continue  with 30 min of aerobic exercise without signs/symptoms of physical distress. -- Continue with 30 min of aerobic exercise without signs/symptoms of physical distress. Continue with 30 min of aerobic exercise without signs/symptoms of physical distress.   Intensity -- THRR unchanged -- THRR unchanged THRR unchanged     Progression   Progression -- Continue to progress workloads to maintain intensity without signs/symptoms of physical distress. -- Continue to progress workloads to maintain intensity without signs/symptoms of physical distress. Continue to progress workloads to maintain intensity without signs/symptoms of physical distress.   Average METs -- 3.23 -- 3.84 3.59     Resistance Training   Training Prescription -- Yes -- Yes Yes   Weight -- 3 lb -- 3 lb 3 lb   Reps -- 10-15 -- 10-15 10-15     Interval Training   Interval Training -- No -- No No     Treadmill   MPH -- 2.2 -- 2.4 2.5   Grade -- 1.5 -- 1.5 1.5   Minutes -- 15 -- 15 15   METs -- 3.14 -- 3.33 3.43     Recumbant Bike   Level -- 5 -- 5 --   Watts -- 33 -- -- --   Minutes -- 15 -- 15 --   METs -- 3.37 -- -- --     REL-XR   Level -- 11 -- 5 5   Minutes -- 15 -- 15 15   METs -- 4.5 -- 5.4 5.5     T5 Nustep  Level -- 4 -- 3 4   Minutes -- 15 -- 15 15   METs -- 2.7 -- 2.8 3     Home Exercise Plan   Plans to continue exercise at -- -- Home (comment)  walking Home (comment)  walking Home (comment)  walking   Frequency -- -- Add 2 additional days to program exercise sessions. Add 2 additional days to program exercise sessions. Add 2 additional days to program exercise sessions.   Initial Home Exercises Provided -- -- 07/25/22 07/25/22 07/25/22     Oxygen   Maintain Oxygen Saturation -- 88% or higher 88% or higher 88% or higher 88% or higher            Exercise Comments:   Exercise Goals and Review:   Exercise Goals     Row Name 07/04/22 1257             Exercise Goals   Increase Physical  Activity Yes       Intervention Provide advice, education, support and counseling about physical activity/exercise needs.;Develop an individualized exercise prescription for aerobic and resistive training based on initial evaluation findings, risk stratification, comorbidities and participant's personal goals.       Expected Outcomes Short Term: Attend rehab on a regular basis to increase amount of physical activity.;Long Term: Add in home exercise to make exercise part of routine and to increase amount of physical activity.;Long Term: Exercising regularly at least 3-5 days a week.       Increase Strength and Stamina Yes       Intervention Provide advice, education, support and counseling about physical activity/exercise needs.;Develop an individualized exercise prescription for aerobic and resistive training based on initial evaluation findings, risk stratification, comorbidities and participant's personal goals.       Expected Outcomes Short Term: Increase workloads from initial exercise prescription for resistance, speed, and METs.;Short Term: Perform resistance training exercises routinely during rehab and add in resistance training at home;Long Term: Improve cardiorespiratory fitness, muscular endurance and strength as measured by increased METs and functional capacity (6MWT)       Able to understand and use rate of perceived exertion (RPE) scale Yes       Intervention Provide education and explanation on how to use RPE scale       Expected Outcomes Short Term: Able to use RPE daily in rehab to express subjective intensity level;Long Term:  Able to use RPE to guide intensity level when exercising independently       Able to understand and use Dyspnea scale Yes       Intervention Provide education and explanation on how to use Dyspnea scale       Expected Outcomes Short Term: Able to use Dyspnea scale daily in rehab to express subjective sense of shortness of breath during exertion;Long Term: Able to  use Dyspnea scale to guide intensity level when exercising independently       Knowledge and understanding of Target Heart Rate Range (THRR) Yes       Intervention Provide education and explanation of THRR including how the numbers were predicted and where they are located for reference       Expected Outcomes Short Term: Able to state/look up THRR;Long Term: Able to use THRR to govern intensity when exercising independently;Short Term: Able to use daily as guideline for intensity in rehab       Able to check pulse independently Yes       Intervention Provide education and demonstration on  how to check pulse in carotid and radial arteries.;Review the importance of being able to check your own pulse for safety during independent exercise       Expected Outcomes Short Term: Able to explain why pulse checking is important during independent exercise;Long Term: Able to check pulse independently and accurately       Understanding of Exercise Prescription Yes       Intervention Provide education, explanation, and written materials on patient's individual exercise prescription       Expected Outcomes Short Term: Able to explain program exercise prescription;Long Term: Able to explain home exercise prescription to exercise independently                Exercise Goals Re-Evaluation :  Exercise Goals Re-Evaluation     [place] Name 07/09/22 1341 07/23/22 1414 07/25/22 1514 08/07/22 1046 08/09/22 1339     Exercise Goal Re-Evaluation   Exercise Goals Review Increase Physical Activity;Able to understand and use rate of perceived exertion (RPE) scale;Knowledge and understanding of Target Heart Rate Range (THRR);Understanding of Exercise Prescription;Increase Strength and Stamina;Able to check pulse independently Increase Physical Activity;Understanding of Exercise Prescription;Increase Strength and Stamina Increase Physical Activity;Understanding of Exercise Prescription;Increase Strength and Stamina;Able to  understand and use Dyspnea scale;Able to check pulse independently;Able to understand and use rate of perceived exertion (RPE) scale;Knowledge and understanding of Target Heart Rate Range (THRR) Increase Physical Activity;Increase Strength and Stamina;Understanding of Exercise Prescription Increase Physical Activity;Increase Strength and Stamina;Understanding of Exercise Prescription   Comments Reviewed RPE and dyspnea scales, THR and program prescription with pt today.  Pt voiced understanding and was given a copy of goals to take home. Garrett Park is off to a good start in rehab. He had an overall average MET level of 3.23 METs. He also increased his incline on the treadmill to 1.5% while maintaining a speed of 2.2 mph. He also improved to level 11 on the XR and level 4 on the T5. We will continue to monitor his progress in the program. Reviewed home exercise with pt today.  Pt plans to walk for exercise.  Reviewed THR, pulse, RPE, sign and symptoms, pulse oximetery and when to call 911 or MD.  Also discussed weather considerations and indoor options.  Pt voiced understanding. Garrett Park is doing well with his rehab.  He is up to level 5 on the XR and 2.4 mph on the treadmill.  We will conitnue to monitor his progress. Garrett Park is doing well in rehab.  He has been doing a little bit of home exercise.  He is staying active with doing things around the house.  He does feel like he is doing better with his strength and stamina.   Expected Outcomes Short: Use RPE daily to regulate intensity. Long: Follow program prescription in THR. Short: Continue to increase workloads. Long: Continue to increase strength and stamina. Short: Structure walk to build up to 30 minutes at once Long: Continue independent exercise at appropriate prescription Short: Increase hand weights Long: Conitnue to improve stamina Short:  Continue to add in more exercise at home Long: Continue to improve stamina.    [place] Name 08/20/22 1326              Exercise Goal Re-Evaluation   Exercise Goals Review Increase Physical Activity;Increase Strength and Stamina;Understanding of Exercise Prescription       Comments Garrett Park is doing well in rehab. He recently increased his speed on the treadmill to 2.5 mph while maintaining an incline of 1.5%. He also  improved to level 4 on the T5. He has tolerated 3 lb weights for resistance exercise as well, and would benefit from increasing to 4 or 5 lb weights. We will continue to monitor.       Expected Outcomes Short: Try 4 or 5 lb weights for resistance training. Long: Continue to increase strength and stamina.                Discharge Exercise Prescription (Final Exercise Prescription Changes):  Exercise Prescription Changes - 08/20/22 1300       Response to Exercise   Blood Pressure (Admit) 118/64    Blood Pressure (Exit) 108/60    Heart Rate (Admit) 70 bpm    Heart Rate (Exercise) 119 bpm    Heart Rate (Exit) 78 bpm    Rating of Perceived Exertion (Exercise) 12    Symptoms none    Duration Continue with 30 min of aerobic exercise without signs/symptoms of physical distress.    Intensity THRR unchanged      Progression   Progression Continue to progress workloads to maintain intensity without signs/symptoms of physical distress.    Average METs 3.59      Resistance Training   Training Prescription Yes    Weight 3 lb    Reps 10-15      Interval Training   Interval Training No      Treadmill   MPH 2.5    Grade 1.5    Minutes 15    METs 3.43      REL-XR   Level 5    Minutes 15    METs 5.5      T5 Nustep   Level 4    Minutes 15    METs 3      Home Exercise Plan   Plans to continue exercise at Home (comment)   walking   Frequency Add 2 additional days to program exercise sessions.    Initial Home Exercises Provided 07/25/22      Oxygen   Maintain Oxygen Saturation 88% or higher             Nutrition:  Target Goals: Understanding of nutrition guidelines, daily intake  of sodium <1545m, cholesterol <2046m calories 30% from fat and 7% or less from saturated fats, daily to have 5 or more servings of fruits and vegetables.  Education: All About Nutrition: -Group instruction provided by verbal, written material, interactive activities, discussions, models, and posters to present general guidelines for heart healthy nutrition including fat, fiber, MyPlate, the role of sodium in heart healthy nutrition, utilization of the nutrition label, and utilization of this knowledge for meal planning. Follow up email sent as well. Written material given at graduation. [facility]  Education need identified 07/04/22       Biometrics:  Pre Biometrics - 07/04/22 1238       Pre Biometrics   Height 5' 11.5" (1.816 m)    Weight 183 lb 14.4 oz (83.4 kg)    Waist Circumference 39 inches    Hip Circumference 41 inches    Waist to Hip Ratio 0.95 %    BMI (Calculated) 25.29    Single Leg Stand 30 seconds              Nutrition Therapy Plan and Nutrition Goals:  Nutrition Therapy & Goals - 08/06/22 0804       Nutrition Therapy   Diet Heart healthy, low Na  Drug/Food Interactions Statins/Certain Fruits    Protein (specify units) 65-70g    Fiber 30 grams    Whole Grain Foods 3 servings    Saturated Fats 16 max. grams    Fruits and Vegetables 8 servings/day    Sodium 2 grams      Personal Nutrition Goals   Nutrition Goal ST: review paperwork LT: have at least 5-8 servings of fruits/vegetables per day, make at least have grains eaten whole grains, limit saturated fat <16g/day, limit added sugar <36g/day    Comments 56 y.o. M admitted to cardiac rehab s/p NSTEMI, CABG x 1. PMHx includes OSA, CAD, current smoker. Relevant medications includes vit C, lipitor, melatonin, protonix. B: usually small: eggs, biscuits, pancakes, grits L: chicken, steak, pork D: chinese food, steaks, or hamburgers with some  non-starchy vegetables. Drinks: a lot of water or sweet tea (2-3x/day). After getting discharged from the hospital he has cut out redbulls. He reports no major barriers at this time, but would like to learn more about heart healthy eating. Discussed heart healthy eating and recommended changes; Garrett Park would like to review the paperwork and try out some small changes from there.      Intervention Plan   Intervention Prescribe, educate and counsel regarding individualized specific dietary modifications aiming towards targeted core components such as weight, hypertension, lipid management, diabetes, heart failure and other comorbidities.    Expected Outcomes Short Term Goal: Understand basic principles of dietary content, such as calories, fat, sodium, cholesterol and nutrients.;Short Term Goal: A plan has been developed with personal nutrition goals set during dietitian appointment.;Long Term Goal: Adherence to prescribed nutrition plan.             Nutrition Assessments:  MEDIFICTS Score Key: ?70 Need to make dietary changes  40-70 Heart Healthy Diet ? 40 Therapeutic Level Cholesterol Diet  Flowsheet Row Cardiac Rehab from 07/04/2022 in [facility] Cardiac and Pulmonary Rehab  Picture Your Plate Total Score on Admission 37      Picture Your Plate Scores: <42 Unhealthy dietary pattern with much room for improvement. 41-50 Dietary pattern unlikely to meet recommendations for good health and room for improvement. 51-60 More healthful dietary pattern, with some room for improvement.  >60 Healthy dietary pattern, although there may be some specific behaviors that could be improved.    Nutrition Goals Re-Evaluation:  Nutrition Goals Re-Evaluation     [place] Name 07/25/22 1400             Goals   Nutrition Goal Garrett Park does not have an appointment with the RD but will need one scheduled.       Comment Short: Make appt with RD Long: Follow recommendations by RD                Nutrition Goals  Discharge (Final Nutrition Goals Re-Evaluation):  Nutrition Goals Re-Evaluation - 07/25/22 1400       Goals   Nutrition Goal Garrett Park does not have an appointment with the RD but will need one scheduled.    Comment Short: Make appt with RD Long: Follow recommendations by RD             Psychosocial: Target Goals: Acknowledge presence or absence of significant depression and/or stress, maximize coping skills, provide positive support system. Participant is able to verbalize types and ability to use techniques and skills needed for reducing stress and depression.   Education: Stress, Anxiety, and Depression - Group verbal and visual presentation to define topics covered.  Reviews how  body is impacted by stress, anxiety, and depression.  Also discusses healthy ways to reduce stress and to treat/manage anxiety and depression.  Written material given at graduation. Flowsheet Row Cardiac Rehab from 08/22/2022 in [facility] Cardiac and Pulmonary Rehab  Date 08/22/22  Educator [place]  Instruction Review Code 1- Verbalizes Understanding       Education: Sleep Hygiene -Provides group verbal and written instruction about how sleep can affect your health.  Define sleep hygiene, discuss sleep cycles and impact of sleep habits. Review good sleep hygiene tips.    Initial Review & Psychosocial Screening:  Initial Psych Review & Screening - 06/26/22 0912       Initial Review   Current issues with None Identified      Family Dynamics   Good Support System? Yes   wife,neice and her child, neighbors, family     Barriers   Psychosocial barriers to participate in program There are no identifiable barriers or psychosocial needs.      Screening Interventions   Interventions Encouraged to exercise;To provide support and resources with identified psychosocial needs;Provide feedback about the scores to participant    Expected Outcomes Short Term goal: Utilizing psychosocial counselor, staff and physician to  assist with identification of specific Stressors or current issues interfering with healing process. Setting desired goal for each stressor or current issue identified.;Long Term Goal: Stressors or current issues are controlled or eliminated.;Short Term goal: Identification and review with participant of any Quality of Life or Depression concerns found by scoring the questionnaire.;Long Term goal: The participant improves quality of Life and PHQ9 Scores as seen by post scores and/or verbalization of changes             Quality of Life Scores:   Quality of Life - 07/04/22 1232       Quality of Life   Select Quality of Life      Quality of Life Scores   Health/Function Pre 20.83 %    Socioeconomic Pre 27.43 %    Psych/Spiritual Pre 28.29 %    Family Pre 28.8 %    GLOBAL Pre 24.9 %            Scores of 19 and below usually indicate a poorer quality of life in these areas.  A difference of  2-3 points is a clinically meaningful difference.  A difference of 2-3 points in the total score of the Quality of Life Index has been associated with significant improvement in overall quality of life, self-image, physical symptoms, and general health in studies assessing change in quality of life.  PHQ-9: Review Flowsheet        No data to display         Interpretation of Total Score  Total Score Depression Severity:  1-4 = Minimal depression, 5-9 = Mild depression, 10-14 = Moderate depression, 15-19 = Moderately severe depression, 20-27 = Severe depression   Psychosocial Evaluation and Intervention:  Psychosocial Evaluation - 06/26/22 0923       Psychosocial Evaluation & Interventions   Interventions Encouraged to exercise with the program and follow exercise prescription    Comments Garrett Park has no barriers to attending the program. He wants to learn all he can to know what to expect. He lives with his wife. He has 2 grown children. His wife , family, neighbors and friends are his  support.  He is ready to get started.    Expected Outcomes STG Garrett Park attends all scheduled sessions, he is able to progress  with his exercise,despite his back problems. LTG Garrett Park is able to continue his progression after discharge    Continue Psychosocial Services  Follow up required by staff             Psychosocial Re-Evaluation:  Psychosocial Re-Evaluation     [place] Name 07/25/22 1406 08/09/22 1343           Psychosocial Re-Evaluation   Current issues with None Identified Current Stress Concerns      Comments Garrett Park is doing well mentally. His wife and kids are good support. His wife just broke her foot and they are busy running her to appointments. He is not taking any medications for depression/ anxiety nor feels that he needs to. There are no big stressors in his life. Denies problem with sleep. He is waiting to get cleared to go back to work which he thinks will be in the next week or 2. He enjoys playing golf and can't wait to get back to that as soon as he is cleared from a sternal precaution standpoint Garrett Park is doing well in rehab.  He is sleeping well.  He still has a good support system.  His biggest stressor and task is to quit smoking.  He continues to work towards that.  He is doing some exercise at home.      Expected Outcomes Short: Continue attendning rehab consistently Long: Continue to maintain positive attitude Short: Continue to add exercise for mental boost Long: Conitnue to focus on positive      Interventions Encouraged to attend Cardiac Rehabilitation for the exercise Encouraged to attend Cardiac Rehabilitation for the exercise      Continue Psychosocial Services  Follow up required by staff Follow up required by staff               Psychosocial Discharge (Final Psychosocial Re-Evaluation):  Psychosocial Re-Evaluation - 08/09/22 1343       Psychosocial Re-Evaluation   Current issues with Current Stress Concerns    Comments Garrett Park is doing well in rehab.  He  is sleeping well.  He still has a good support system.  His biggest stressor and task is to quit smoking.  He continues to work towards that.  He is doing some exercise at home.    Expected Outcomes Short: Continue to add exercise for mental boost Long: Conitnue to focus on positive    Interventions Encouraged to attend Cardiac Rehabilitation for the exercise    Continue Psychosocial Services  Follow up required by staff             Vocational Rehabilitation: Provide vocational rehab assistance to qualifying candidates.   Vocational Rehab Evaluation & Intervention:   Education: Education Goals: Education classes will be provided on a variety of topics geared toward better understanding of heart health and risk factor modification. Participant will state understanding/return demonstration of topics presented as noted by education test scores.  Learning Barriers/Preferences:   General Cardiac Education Topics:  AED/CPR: - Group verbal and written instruction with the use of models to demonstrate the basic use of the AED with the basic ABC's of resuscitation.   Anatomy and Cardiac Procedures: - Group verbal and visual presentation and models provide information about basic cardiac anatomy and function. Reviews the testing methods done to diagnose heart disease and the outcomes of the test results. Describes the treatment choices: Medical Management, Angioplasty, or Coronary Bypass Surgery for treating various heart conditions including Myocardial Infarction, Angina, Valve Disease, and Cardiac Arrhythmias.  Written  material given at graduation. Flowsheet Row Cardiac Rehab from 08/22/2022 in [facility] Cardiac and Pulmonary Rehab  Education need identified 07/04/22  Date 07/18/22  Educator SB  Instruction Review Code 1- Verbalizes Understanding       Medication Safety: - Group verbal and visual instruction to review commonly prescribed medications for heart and lung disease. Reviews the  medication, class of the drug, and side effects. Includes the steps to properly store meds and maintain the prescription regimen.  Written material given at graduation. Flowsheet Row Cardiac Rehab from 08/22/2022 in [facility] Cardiac and Pulmonary Rehab  Date 08/01/22  Educator SB  Instruction Review Code 1- Verbalizes Understanding       Intimacy: - Group verbal instruction through game format to discuss how heart and lung disease can affect sexual intimacy. Written material given at graduation..   Know Your Numbers and Heart Failure: - Group verbal and visual instruction to discuss disease risk factors for cardiac and pulmonary disease and treatment options.  Reviews associated critical values for Overweight/Obesity, Hypertension, Cholesterol, and Diabetes.  Discusses basics of heart failure: signs/symptoms and treatments.  Introduces Heart Failure Zone chart for action plan for heart failure.  Written material given at graduation.   Infection Prevention: - Provides verbal and written material to individual with discussion of infection control including proper hand washing and proper equipment cleaning during exercise session. Flowsheet Row Cardiac Rehab from 08/22/2022 in [facility] Cardiac and Pulmonary Rehab  Education need identified 07/04/22  Date 07/04/22  Educator [place]  Instruction Review Code 1- Verbalizes Understanding       Falls Prevention: - Provides verbal and written material to individual with discussion of falls prevention and safety. Flowsheet Row Cardiac Rehab from 06/26/2022 in [facility] Cardiac and Pulmonary Rehab  Date 06/26/22  Educator SB  Instruction Review Code 1- Verbalizes Understanding       Other: -Provides group and verbal instruction on various topics (see comments)   Knowledge Questionnaire Score:  Knowledge Questionnaire Score - 07/04/22 1226       Knowledge Questionnaire Score   Pre Score 24/26             Core Components/Risk Factors/Patient  Goals at Admission:  Personal Goals and Risk Factors at Admission - 07/04/22 1257       Core Components/Risk Factors/Patient Goals on Admission    Weight Management Yes;Weight Maintenance    Intervention Weight Management: Develop a combined nutrition and exercise program designed to reach desired caloric intake, while maintaining appropriate intake of nutrient and fiber, sodium and fats, and appropriate energy expenditure required for the weight goal.;Weight Management/Obesity: Establish reasonable short term and long term weight goals.;Weight Management: Provide education and appropriate resources to help participant work on and attain dietary goals.    Admit Weight 183 lb (83 kg)    Goal Weight: Short Term 183 lb (83 kg)    Goal Weight: Long Term 183 lb (83 kg)    Expected Outcomes Short Term: Continue to assess and modify interventions until short term weight is achieved;Long Term: Adherence to nutrition and physical activity/exercise program aimed toward attainment of established weight goal;Weight Maintenance: Understanding of the daily nutrition guidelines, which includes 25-35% calories from fat, 7% or less cal from saturated fats, less than 225m cholesterol, less than 1.5gm of sodium, & 5 or more servings of fruits and vegetables daily;Understanding recommendations for meals to include 15-35% energy as protein, 25-35% energy from fat, 35-60% energy from carbohydrates, less than 2049mof dietary cholesterol, 20-35 gm of total  fiber daily;Understanding of distribution of calorie intake throughout the day with the consumption of 4-5 meals/snacks    Tobacco Cessation Yes    Number of packs per day Garrett Park is a current tobacco user. Intervention for tobacco cessation was provided at the initial medical review. He was asked about readiness to quit and reported that he has decreased number of cigarettes since surgery. Was at 1 1/4 pack a day. Is currently at 5-7 cigarettes a day, sometimes 10, and is  planning to decrease to zero. He is not using anything at this time to help him. He does not have a set quit date . Patient was advised and educated about tobacco cessation using combination therapy, tobacco cessation classes, quit line, and quit smoking apps. Patient demonstrated understanding of this material. Staff will continue to provide encouragement and follow up with the patient throughout the program.    Intervention Assist the participant in steps to quit. Provide individualized education and counseling about committing to Tobacco Cessation, relapse prevention, and pharmacological support that can be provided by physician.;Advice worker, assist with locating and accessing local/national Quit Smoking programs, and support quit date choice.    Expected Outcomes Short Term: Will demonstrate readiness to quit, by selecting a quit date.;Short Term: Will quit all tobacco product use, adhering to prevention of relapse plan.;Long Term: Complete abstinence from all tobacco products for at least 12 months from quit date.    Lipids Yes    Intervention Provide education and support for participant on nutrition & aerobic/resistive exercise along with prescribed medications to achieve LDL <51m, HDL >460m    Expected Outcomes Short Term: Participant states understanding of desired cholesterol values and is compliant with medications prescribed. Participant is following exercise prescription and nutrition guidelines.;Long Term: Cholesterol controlled with medications as prescribed, with individualized exercise RX and with personalized nutrition plan. Value goals: LDL < 7022mHDL > 40 mg.             Education:Diabetes - Individual verbal and written instruction to review signs/symptoms of diabetes, desired ranges of glucose level fasting, after meals and with exercise. Acknowledge that pre and post exercise glucose checks will be done for 3 sessions at entry of program.   Core  Components/Risk Factors/Patient Goals Review:   Goals and Risk Factor Review     Row Name 07/25/22 1401 08/09/22 1340           Core Components/Risk Factors/Patient Goals Review   Personal Goals Review Weight Management/Obesity;Tobacco Cessation Weight Management/Obesity;Tobacco Cessation      Review Garrett Park looking to maintain his weight. He had already lost 10lbs since his heart event but thinks he's gaining it slowly back. He is aware to be mindful of sudden weight gain in a short period of time. He is still smoking 8-10 cigarettes/ day but is still wanting to quit. He is not interested in trying the nicotine patches or gum. We talked about using fake ciagerettes to help with his oral fixation. He has not quite set a quit date but was encouraged to do so. We hope to see his # of cigarettes overtime start to decrease Garrett Park doing well in rehab.  His weight has crept up some but he is hoping to hold steady.  He is still around 8 cigarettes a day.  He is starting to associate them with certain habits so starting to not crave as a fix so much.  He is doing well with his medications.      Expected  Outcomes Short: Work on smoking cessation Long: Continue long-term abstinence of tobacco Short; Continue to work toward cessation Long: continue to abstain.               Core Components/Risk Factors/Patient Goals at Discharge (Final Review):   Goals and Risk Factor Review - 08/09/22 1340       Core Components/Risk Factors/Patient Goals Review   Personal Goals Review Weight Management/Obesity;Tobacco Cessation    Review Garrett Park is doing well in rehab.  His weight has crept up some but he is hoping to hold steady.  He is still around 8 cigarettes a day.  He is starting to associate them with certain habits so starting to not crave as a fix so much.  He is doing well with his medications.    Expected Outcomes Short; Continue to work toward cessation Long: continue to abstain.             ITP  Comments:  ITP Comments     Row Name 06/26/22 9833 06/26/22 0932 07/04/22 1222 07/09/22 1340 08/01/22 0709   ITP Comments Virtual orientation call completed today. he has an appointment on Date: 07/04/2022  for EP eval and gym Orientation.  Documentation of diagnosis can be found in [facility] Date: 05/29/2022 . Garrett Park is a current tobacco user. Intervention for tobacco cessation was provided at the initial medical review. He was asked about readiness to quit and reported that he has decreased number of cigarettes since surgery. Was at 1 1/4 pack a day. Is currently at 5-7 cigarettes a day and is planning to decrease to zero. He does not have a set quit date . Patient was advised and educated about tobacco cessation using combination therapy, tobacco cessation classes, quit line, and quit smoking apps. Patient demonstrated understanding of this material. Staff will continue to provide encouragement and follow up with the patient throughout the program. Completed 6MWT and gym orientation. Initial ITP created and sent for review to Dr. Emily Filbert, Medical Director. First full day of exercise!  Patient was oriented to gym and equipment including functions, settings, policies, and procedures.  Patient's individual exercise prescription and treatment plan were reviewed.  All starting workloads were established based on the results of the 6 minute walk test done at initial orientation visit.  The plan for exercise progression was also introduced and progression will be customized based on patient's performance and goals. 30 Day review completed. Medical Director ITP review done, changes made as directed, and signed approval by Medical Director.   New to program    Row Name 08/06/22 1429 08/29/22 0925         ITP Comments Completed initial RD consultation 30 Day review completed. Medical Director ITP review done, changes made as directed, and signed approval by Medical Director.               Comments:

## 2022-08-30 ENCOUNTER — Encounter: Payer: BC Managed Care – PPO | Admitting: *Deleted

## 2022-08-30 DIAGNOSIS — I252 Old myocardial infarction: Secondary | ICD-10-CM | POA: Diagnosis not present

## 2022-08-30 DIAGNOSIS — I214 Non-ST elevation (NSTEMI) myocardial infarction: Secondary | ICD-10-CM

## 2022-08-30 DIAGNOSIS — Z48812 Encounter for surgical aftercare following surgery on the circulatory system: Secondary | ICD-10-CM | POA: Diagnosis not present

## 2022-08-30 DIAGNOSIS — Z951 Presence of aortocoronary bypass graft: Secondary | ICD-10-CM | POA: Diagnosis not present

## 2022-08-30 NOTE — Progress Notes (Signed)
Daily Session Note  Patient Details  Name: Garrett Park MRN: [id number] Date of Birth: [birth year] Referring Provider:   Flowsheet Row Cardiac Rehab from 07/04/2022 in [facility] Cardiac and Pulmonary Rehab  Referring Provider Serafina Royals MD       Encounter Date: 08/30/2022  Check In:  Session Check In - 08/30/22 1341       Check-In   Supervising physician immediately available to respond to emergencies See telemetry face sheet for immediately available ER MD    Location [facility]-Cardiac & Pulmonary Rehab    Staff Present Antionette Fairy, BS, Exercise Physiologist;Joseph Rosebud Poles, RN, [place]    Virtual Visit No    Medication changes reported     No    Fall or balance concerns reported    No    Tobacco Cessation No Change    Warm-up and Cool-down Performed on first and last piece of equipment    Resistance Training Performed Yes    VAD Patient? No    PAD/SET Patient? No      Pain Assessment   Currently in Pain? No/denies                Social History   Tobacco Use  Smoking Status Every Day   Packs/day: 1.00   Years: 40.00   Total pack years: 40.00   Types: Cigarettes  Smokeless Tobacco Never  Tobacco Comments   IS working on quitting. Down to 5-7 cigarettes a day . 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   No quit date set.     Goals Met:  Independence with exercise equipment Exercise tolerated well No report of concerns or symptoms today Strength training completed today  Goals Unmet:  Not Applicable  Comments: Pt able to follow exercise prescription today without complaint.  Will continue to monitor for progression.    Dr. Emily Filbert is Medical Director for [place].  Dr. Ottie Glazier is Medical Director for [facility] Pulmonary Rehabilitation.

## 2022-09-03 ENCOUNTER — Encounter: Payer: BC Managed Care – PPO | Admitting: *Deleted

## 2022-09-03 DIAGNOSIS — I214 Non-ST elevation (NSTEMI) myocardial infarction: Secondary | ICD-10-CM

## 2022-09-03 DIAGNOSIS — Z951 Presence of aortocoronary bypass graft: Secondary | ICD-10-CM | POA: Diagnosis not present

## 2022-09-03 DIAGNOSIS — I252 Old myocardial infarction: Secondary | ICD-10-CM | POA: Diagnosis not present

## 2022-09-03 DIAGNOSIS — Z48812 Encounter for surgical aftercare following surgery on the circulatory system: Secondary | ICD-10-CM | POA: Diagnosis not present

## 2022-09-03 NOTE — Progress Notes (Signed)
Daily Session Note  Patient Details  Name: Garrett Park MRN: [id number] Date of Birth: 1965-12-06 Referring Provider:   Flowsheet Row Cardiac Rehab from 07/04/2022 in [facility] Cardiac and Pulmonary Rehab  Referring Provider Serafina Royals MD       Encounter Date: 09/03/2022  Check In:  Session Check In - 09/03/22 1334       Check-In   Supervising physician immediately available to respond to emergencies See telemetry face sheet for immediately available ER MD    Location [facility]-Cardiac & Pulmonary Rehab    Staff Present Justin Mend, RCP,RRT,BSRT;Vilma Will Sherryll Burger, RN Odelia Gage, RN, ADN    Virtual Visit No    Medication changes reported     No    Fall or balance concerns reported    No    Tobacco Cessation No Change    Current number of cigarettes/nicotine per day     8    Warm-up and Cool-down Performed on first and last piece of equipment    Resistance Training Performed Yes    VAD Patient? No    PAD/SET Patient? No      Pain Assessment   Currently in Pain? No/denies                Social History   Tobacco Use  Smoking Status Every Day   Packs/day: 1.00   Years: 40.00   Total pack years: 40.00   Types: Cigarettes  Smokeless Tobacco Never  Tobacco Comments   IS working on quitting. Down to 5-7 cigarettes a day . 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   No quit date set.     Goals Met:  Independence with exercise equipment Exercise tolerated well No report of concerns or symptoms today Strength training completed today  Goals Unmet:  Not Applicable  Comments: Pt able to follow exercise prescription today without complaint.  Will continue to monitor for progression.    Dr. Emily Filbert is Medical Director for [place].  Dr. Ottie Glazier is Medical Director for [facility] Pulmonary Rehabilitation.

## 2022-09-05 ENCOUNTER — Encounter: Payer: BC Managed Care – PPO | Admitting: *Deleted

## 2022-09-05 VITALS — Ht 71.5 in | Wt 193.0 lb

## 2022-09-05 DIAGNOSIS — I214 Non-ST elevation (NSTEMI) myocardial infarction: Secondary | ICD-10-CM | POA: Diagnosis not present

## 2022-09-05 DIAGNOSIS — Z951 Presence of aortocoronary bypass graft: Secondary | ICD-10-CM

## 2022-09-05 DIAGNOSIS — I252 Old myocardial infarction: Secondary | ICD-10-CM | POA: Diagnosis not present

## 2022-09-05 DIAGNOSIS — Z48812 Encounter for surgical aftercare following surgery on the circulatory system: Secondary | ICD-10-CM | POA: Diagnosis not present

## 2022-09-05 NOTE — Patient Instructions (Signed)
Discharge Patient Instructions  Patient Details  Name: Garrett Park MRN: [id number] Date of Birth: 10-29-1966 Referring Provider:  Corey Skains, MD   Number of Visits: 63  Reason for Discharge:  Patient reached a stable level of exercise. Patient independent in their exercise. Patient has met program and personal goals.  Smoking History:  Social History   Tobacco Use  Smoking Status Every Day   Packs/day: 1.00   Years: 40.00   Total pack years: 40.00   Types: Cigarettes  Smokeless Tobacco Never  Tobacco Comments   IS working on quitting. Down to 5-7 cigarettes a day . 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   No quit date set.     Diagnosis:  NSTEMI (non-ST elevation myocardial infarction) (HCC)  S/P CABG x 1  Initial Exercise Prescription:  Initial Exercise Prescription - 07/04/22 1200       Date of Initial Exercise RX and Referring Provider   Date 07/04/22    Referring Provider Garrett Royals MD      Oxygen   Maintain Oxygen Saturation 88% or higher      Treadmill   MPH 2.2    Grade 1    Minutes 15    METs 2.99      Recumbant Bike   Level 3    RPM 60    Watts 30    Minutes 15    METs 3.3      REL-XR   Level 2    Speed 50    Minutes 15    METs 3.3      T5 Nustep   Level 2    SPM 80    Minutes 15    METs 3.3      Prescription Details   Frequency (times per week) 3    Duration Progress to 30 minutes of continuous aerobic without signs/symptoms of physical distress      Intensity   THRR 40-80% of Max Heartrate 109 - 145    Ratings of Perceived Exertion 11-13    Perceived Dyspnea 0-4      Progression   Progression Continue to progress workloads to maintain intensity without signs/symptoms of physical distress.      Resistance Training   Training Prescription Yes    Weight 3 lb    Reps 10-15             Discharge Exercise Prescription (Final Exercise Prescription Changes):  Exercise Prescription Changes - 09/03/22 1000        Response to Exercise   Blood Pressure (Admit) 114/62    Blood Pressure (Exit) 112/62    Heart Rate (Admit) 75 bpm    Heart Rate (Exercise) 103 bpm    Heart Rate (Exit) 75 bpm    Rating of Perceived Exertion (Exercise) 12    Symptoms none    Duration Continue with 30 min of aerobic exercise without signs/symptoms of physical distress.    Intensity THRR unchanged      Progression   Progression Continue to progress workloads to maintain intensity without signs/symptoms of physical distress.    Average METs 3.65      Resistance Training   Training Prescription Yes    Weight 5 lb    Reps 10-15      Interval Training   Interval Training No      Treadmill   MPH 2.6    Grade 1.5    Minutes 15    METs 3.53  Recumbant Elliptical   Level 4    Minutes 15    METs 3.4      REL-XR   Level 5    Minutes 15      T5 Nustep   Level 3    Minutes 15    METs 2.8      Home Exercise Plan   Plans to continue exercise at Home (comment)   walking   Frequency Add 2 additional days to program exercise sessions.    Initial Home Exercises Provided 07/25/22      Oxygen   Maintain Oxygen Saturation 88% or higher             Functional Capacity:  6 Minute Walk     Row Name 07/04/22 1253 09/05/22 1411       6 Minute Walk   Phase Initial Discharge    Distance 1145 feet 1590 feet    Distance % Change -- 38.9 %    Distance Feet Change -- 445 ft    Walk Time 6 minutes 6 minutes    # of Rest Breaks 0 0    MPH 2.16 3.01    METS 3.35 4.31    RPE 9 12    Perceived Dyspnea  0 --    VO2 Peak 11.75 15.07    Symptoms Yes (comment) Yes (comment)    Comments Bilateral hip pain 4/10 chronic hip pain, calf pain 4/10    Resting HR 73 bpm 67 bpm    Resting BP 110/66 124/70    Resting Oxygen Saturation  99 % 98 %    Exercise Oxygen Saturation  during 6 min walk 100 % 99 %    Max Ex. HR 87 bpm 95 bpm    Max Ex. BP 120/64 146/74    2 Minute Post BP 112/66 --              Nutrition & Weight - Outcomes:  Pre Biometrics - 07/04/22 1238       Pre Biometrics   Height 5' 11.5" (1.816 m)    Weight 183 lb 14.4 oz (83.4 kg)    Waist Circumference 39 inches    Hip Circumference 41 inches    Waist to Hip Ratio 0.95 %    BMI (Calculated) 25.29    Single Leg Stand 30 seconds             Post Biometrics - 09/05/22 1412        Post  Biometrics   Height 5' 11.5" (1.816 m)    Weight 193 lb (87.5 kg)    Waist Circumference 38.5 inches    Hip Circumference 40 inches    Waist to Hip Ratio 0.96 %    BMI (Calculated) 26.55    Single Leg Stand 30 seconds             Nutrition:  Nutrition Therapy & Goals - 08/06/22 0804       Nutrition Therapy   Diet Heart healthy, low Na    Drug/Food Interactions Statins/Certain Fruits    Protein (specify units) 65-70g    Fiber 30 grams    Whole Grain Foods 3 servings    Saturated Fats 16 max. grams    Fruits and Vegetables 8 servings/day    Sodium 2 grams      Personal Nutrition Goals   Nutrition Goal ST: review paperwork LT: have at least 5-8 servings of fruits/vegetables per day, make at least have grains eaten whole grains, limit  saturated fat <16g/day, limit added sugar <36g/day    Comments 56 y.o. M admitted to cardiac rehab s/p NSTEMI, CABG x 1. PMHx includes OSA, CAD, current smoker. Relevant medications includes vit C, lipitor, melatonin, protonix. B: usually small: eggs, biscuits, pancakes, grits L: chicken, steak, pork D: chinese food, steaks, or hamburgers with some non-starchy vegetables. Drinks: a lot of water or sweet tea (2-3x/day). After getting discharged from the hospital he has cut out redbulls. He reports no major barriers at this time, but would like to learn more about heart healthy eating. Discussed heart healthy eating and recommended changes; Garrett Park would like to review the paperwork and try out some small changes from there.      Intervention Plan   Intervention Prescribe, educate and  counsel regarding individualized specific dietary modifications aiming towards targeted core components such as weight, hypertension, lipid management, diabetes, heart failure and other comorbidities.    Expected Outcomes Short Term Goal: Understand basic principles of dietary content, such as calories, fat, sodium, cholesterol and nutrients.;Short Term Goal: A plan has been developed with personal nutrition goals set during dietitian appointment.;Long Term Goal: Adherence to prescribed nutrition plan.            Goals reviewed with patient; copy given to patient.

## 2022-09-05 NOTE — Progress Notes (Signed)
Daily Session Note  Patient Details  Name: Garrett Park MRN: [id number] Date of Birth: 06-Mar-1966 Referring Provider:   Flowsheet Row Cardiac Rehab from 07/04/2022 in [facility]  Referring Provider Serafina Royals MD       Encounter Date: 09/05/2022  Check In:  Session Check In - 09/05/22 1348       Check-In   Supervising physician immediately available to respond to emergencies See telemetry face sheet for immediately available ER MD    Location [facility]-Cardiac & Pulmonary Rehab    Staff Present Renita Papa, RN Odelia Gage, RN, ADN;Jessica Luan Pulling, MA, RCEP, CCRP, CCET    Virtual Visit No    Medication changes reported     No    Fall or balance concerns reported    No    Tobacco Cessation No Change    Warm-up and Cool-down Performed on first and last piece of equipment    Resistance Training Performed Yes    VAD Patient? No    PAD/SET Patient? No      Pain Assessment   Currently in Pain? No/denies                Social History   Tobacco Use  Smoking Status Every Day   Packs/day: 1.00   Years: 40.00   Total pack years: 40.00   Types: Cigarettes  Smokeless Tobacco Never  Tobacco Comments   IS working on quitting. Down to 5-7 cigarettes a day . 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   No quit date set.     Goals Met:  Independence with exercise equipment Exercise tolerated well No report of concerns or symptoms today Strength training completed today  Goals Unmet:  Not Applicable  Comments: Pt able to follow exercise prescription today without complaint.  Will continue to monitor for progression.   [place] Name 07/04/22 1253 09/05/22 1411       6 Minute Walk   Phase Initial Discharge    Distance 1145 feet 1590 feet    Distance % Change -- 38.9 %    Distance Feet Change -- 445 ft    Walk Time 6 minutes 6 minutes    # of Rest Breaks 0 0    MPH 2.16 3.01    METS 3.35 4.31    RPE 9 12    Perceived Dyspnea   0 --    VO2 Peak 11.75 15.07    Symptoms Yes (comment) Yes (comment)    Comments Bilateral hip pain 4/10 chronic hip pain, calf pain 4/10    Resting HR 73 bpm 67 bpm    Resting BP 110/66 124/70    Resting Oxygen Saturation  99 % 98 %    Exercise Oxygen Saturation  during 6 min walk 100 % 99 %    Max Ex. HR 87 bpm 95 bpm    Max Ex. BP 120/64 146/74    2 Minute Post BP 112/66 --              Dr. Emily Filbert is Medical Director for [name].  Dr. Ottie Glazier is Medical Director for [facility] Pulmonary Rehabilitation.

## 2022-09-06 ENCOUNTER — Encounter: Payer: BC Managed Care – PPO | Admitting: *Deleted

## 2022-09-06 DIAGNOSIS — Z951 Presence of aortocoronary bypass graft: Secondary | ICD-10-CM | POA: Diagnosis not present

## 2022-09-06 DIAGNOSIS — I214 Non-ST elevation (NSTEMI) myocardial infarction: Secondary | ICD-10-CM

## 2022-09-06 DIAGNOSIS — I252 Old myocardial infarction: Secondary | ICD-10-CM | POA: Diagnosis not present

## 2022-09-06 DIAGNOSIS — Z48812 Encounter for surgical aftercare following surgery on the circulatory system: Secondary | ICD-10-CM | POA: Diagnosis not present

## 2022-09-06 NOTE — Progress Notes (Signed)
Daily Session Note  Patient Details  Name: Garrett Park MRN: [id number] Date of Birth: February 15, 1966 Referring Provider:   Flowsheet Row Cardiac Rehab from 07/04/2022 in [facility] Cardiac and Pulmonary Rehab  Referring Provider Serafina Royals MD       Encounter Date: 09/06/2022  Check In:  Session Check In - 09/06/22 1418       Check-In   Supervising physician immediately available to respond to emergencies See telemetry face sheet for immediately available ER MD    Location [facility]-Cardiac & Pulmonary Rehab    Staff Present Heath Lark, RN, BSN, CCRP;Joseph [place], RCP,RRT,BSRT;Jessica Lovejoy, [place], [place], CCRP, CCET    Virtual Visit No    Medication changes reported     No    Fall or balance concerns reported    No    Warm-up and Cool-down Performed on first and last piece of equipment    Resistance Training Performed Yes    VAD Patient? No    PAD/SET Patient? No      Pain Assessment   Currently in Pain? No/denies                Social History   Tobacco Use  Smoking Status Every Day   Packs/day: 1.00   Years: 40.00   Total pack years: 40.00   Types: Cigarettes  Smokeless Tobacco Never  Tobacco Comments   IS working on quitting. Down to 5-7 cigarettes a day . 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   No quit date set.     Goals Met:  Independence with exercise equipment Exercise tolerated well No report of concerns or symptoms today  Goals Unmet:  Not Applicable  Comments: Pt able to follow exercise prescription today without complaint.  Will continue to monitor for progression.    Dr. Emily Filbert is Medical Director for [facility].  Dr. Ottie Glazier is Medical Director for [facility] Pulmonary Rehabilitation.

## 2022-09-10 ENCOUNTER — Encounter: Payer: BC Managed Care – PPO | Admitting: *Deleted

## 2022-09-10 DIAGNOSIS — Z48812 Encounter for surgical aftercare following surgery on the circulatory system: Secondary | ICD-10-CM | POA: Diagnosis not present

## 2022-09-10 DIAGNOSIS — I252 Old myocardial infarction: Secondary | ICD-10-CM | POA: Diagnosis not present

## 2022-09-10 DIAGNOSIS — I214 Non-ST elevation (NSTEMI) myocardial infarction: Secondary | ICD-10-CM

## 2022-09-10 DIAGNOSIS — Z951 Presence of aortocoronary bypass graft: Secondary | ICD-10-CM | POA: Diagnosis not present

## 2022-09-10 NOTE — Progress Notes (Signed)
Daily Session Note  Patient Details  Name: Garrett Park MRN: [id number] Date of Birth: [birth year] Referring Provider:   Flowsheet Row Cardiac Rehab from 07/04/2022 in [facility] Cardiac and Pulmonary Rehab  Referring Provider Serafina Royals MD       Encounter Date: 09/10/2022  Check In:  Session Check In - 09/10/22 1420       Check-In   Supervising physician immediately available to respond to emergencies See telemetry face sheet for immediately available ER MD    Location [facility]-Cardiac & Pulmonary Rehab    Staff Present Heath Lark, RN, BSN, CCRP;Joseph [place], Ernestina Patches, RN, [place]    Virtual Visit No    Medication changes reported     No    Fall or balance concerns reported    No    Warm-up and Cool-down Performed on first and last piece of equipment    Resistance Training Performed Yes    VAD Patient? No    PAD/SET Patient? No      Pain Assessment   Currently in Pain? No/denies                Social History   Tobacco Use  Smoking Status Every Day   Packs/day: 1.00   Years: 40.00   Total pack years: 40.00   Types: Cigarettes  Smokeless Tobacco Never  Tobacco Comments   IS working on quitting. Down to 5-7 cigarettes a day . 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   No quit date set.     Goals Met:  Independence with exercise equipment Exercise tolerated well No report of concerns or symptoms today  Goals Unmet:  Not Applicable  Comments: Pt able to follow exercise prescription today without complaint.  Will continue to monitor for progression.    Dr. Emily Filbert is Medical Director for [facility].  Dr. Ottie Glazier is Medical Director for [facility] Pulmonary Rehabilitation.

## 2022-09-12 ENCOUNTER — Encounter: Payer: BC Managed Care – PPO | Admitting: *Deleted

## 2022-09-12 DIAGNOSIS — I214 Non-ST elevation (NSTEMI) myocardial infarction: Secondary | ICD-10-CM | POA: Diagnosis not present

## 2022-09-12 DIAGNOSIS — Z951 Presence of aortocoronary bypass graft: Secondary | ICD-10-CM

## 2022-09-12 DIAGNOSIS — Z48812 Encounter for surgical aftercare following surgery on the circulatory system: Secondary | ICD-10-CM | POA: Diagnosis not present

## 2022-09-12 DIAGNOSIS — I252 Old myocardial infarction: Secondary | ICD-10-CM | POA: Diagnosis not present

## 2022-09-12 NOTE — Progress Notes (Signed)
Discharge Summary  Garrett Park (DOB 2066-06-30)  Abdul graduated today from  rehab with 36 sessions completed.  Details of the patient's exercise prescription and what She needs to do in order to continue the prescription and progress were discussed with patient.  Patient was given a copy of prescription and goals.  Patient verbalized understanding.  Waddell plans to continue to exercise by walking.    6 Minute Walk     Row Name 07/04/22 1253 09/05/22 1411       6 Minute Walk   Phase Initial Discharge    Distance 1145 feet 1590 feet    Distance % Change -- 38.9 %    Distance Feet Change -- 445 ft    Walk Time 6 minutes 6 minutes    # of Rest Breaks 0 0    MPH 2.16 3.01    METS 3.35 4.31    RPE 9 12    Perceived Dyspnea  0 --    VO2 Peak 11.75 15.07    Symptoms Yes (comment) Yes (comment)    Comments Bilateral hip pain 4/10 chronic hip pain, calf pain 4/10    Resting HR 73 bpm 67 bpm    Resting BP 110/66 124/70    Resting Oxygen Saturation  99 % 98 %    Exercise Oxygen Saturation  during 6 min walk 100 % 99 %    Max Ex. HR 87 bpm 95 bpm    Max Ex. BP 120/64 146/74    2 Minute Post BP 112/66 --

## 2022-09-12 NOTE — Progress Notes (Signed)
Daily Session Note  Patient Details  Name: Garrett Park MRN: [id number] Date of Birth: 07/18/1966 Referring Provider:   Flowsheet Row Cardiac Rehab from 07/04/2022 in [facility] Cardiac and Pulmonary Rehab  Referring Provider Serafina Royals MD       Encounter Date: 09/12/2022  Check In:  Session Check In - 09/12/22 1352       Check-In   Supervising physician immediately available to respond to emergencies See telemetry face sheet for immediately available ER MD    Location [facility]-Cardiac & Pulmonary Rehab    Staff Present Renita Papa, RN Moises Blood, BS, ACSM CEP, Exercise Physiologist;Megan Tamala Julian, RN, ADN    Virtual Visit No    Medication changes reported     No    Fall or balance concerns reported    No    Tobacco Cessation No Change    Current number of cigarettes/nicotine per day     8    Warm-up and Cool-down Performed on first and last piece of equipment    Resistance Training Performed Yes    VAD Patient? No    PAD/SET Patient? No      Pain Assessment   Currently in Pain? No/denies                Social History   Tobacco Use  Smoking Status Every Day   Packs/day: 1.00   Years: 40.00   Total pack years: 40.00   Types: Cigarettes  Smokeless Tobacco Never  Tobacco Comments   IS working on quitting. Down to 5-7 cigarettes a day . 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   No quit date set.     Goals Met:  Independence with exercise equipment Exercise tolerated well No report of concerns or symptoms today Strength training completed today  Goals Unmet:  Not Applicable  Comments:  [name] graduated today from  rehab with 36 sessions completed.  Details of the patient's exercise prescription and what She needs to do in order to continue the prescription and progress were discussed with patient.  Patient was given a copy of prescription and goals.  Patient verbalized understanding.  [name] plans to continue to exercise by walking.    Dr. Emily Filbert is  Medical Director for [facility].  Dr. Ottie Glazier is Medical Director for [facility] Pulmonary Rehabilitation.

## 2022-09-12 NOTE — Progress Notes (Signed)
Cardiac Individual Treatment Plan  Patient Details  Name: Garrett Park MRN: [id number] Date of Birth: Sep 24, 1966 Referring Provider:   Flowsheet Row Cardiac Rehab from 07/04/2022 in [facility] Cardiac and Pulmonary Rehab  Referring Provider Serafina Royals MD       Initial Encounter Date:  Flowsheet Row Cardiac Rehab from 07/04/2022 in [facility] Cardiac and Pulmonary Rehab  Date 07/04/22       Visit Diagnosis: NSTEMI (non-ST elevation myocardial infarction) ([place])  S/P CABG x 1  Patient's Home Medications on Admission:  Current Outpatient Medications:    albuterol (VENTOLIN HFA) 108 (90 Base) MCG/ACT inhaler, , Disp: , Rfl:    ascorbic acid (VITAMIN C) 500 MG tablet, Take by mouth., Disp: , Rfl:    aspirin EC 81 MG tablet, Take by mouth., Disp: , Rfl:    atorvastatin (LIPITOR) 20 MG tablet, Take by mouth., Disp: , Rfl:    clopidogrel (PLAVIX) 75 MG tablet, Take by mouth., Disp: , Rfl:    fluticasone (FLONASE ALLERGY RELIEF) 50 MCG/ACT nasal spray, Place into both nostrils daily as needed for allergies or rhinitis., Disp: , Rfl:    melatonin 5 MG TABS, 1 tablet in the evening Orally Once a day, Disp: , Rfl:    metoprolol tartrate (LOPRESSOR) 25 MG tablet, Take by mouth., Disp: , Rfl:    pantoprazole (PROTONIX) 40 MG tablet, Take by mouth., Disp: , Rfl:   Past Medical History: Past Medical History:  Diagnosis Date   Anxiety yrs ago   Asthma    Chronic low back pain    Dyslipidemia    GERD (gastroesophageal reflux disease)    Heat rash    both arms and chest   Neck pain    OSA (obstructive sleep apnea)    intolerant of cpap   OSA and COPD overlap syndrome ([place]) 01/09/2016   uses online detal device occ   Peyronie disease     Tobacco Use: Social History   Tobacco Use  Smoking Status Every Day   Packs/day: 1.00   Years: 40.00   Total pack years: 40.00   Types: Cigarettes  Smokeless Tobacco Never  Tobacco Comments   IS working on quitting. Down to 5-7 cigarettes a day . Was  1 1/4 pack prior to CABG surgery.   No quit date set.     Labs: Review Flowsheet        No data to display           Exercise Target Goals: Exercise Program Goal: Individual exercise prescription set using results from initial 6 min walk test and THRR while considering  patient's activity barriers and safety.   Exercise Prescription Goal: Initial exercise prescription builds to 30-45 minutes a day of aerobic activity, 2-3 days per week.  Home exercise guidelines will be given to patient during program as part of exercise prescription that the participant will acknowledge.   Education: Aerobic Exercise: - Group verbal and visual presentation on the components of exercise prescription. Introduces F.I.T.T principle from ACSM for exercise prescriptions.  Reviews F.I.T.T. principles of aerobic exercise including progression. Written material given at graduation.   Education: Resistance Exercise: - Group verbal and visual presentation on the components of exercise prescription. Introduces F.I.T.T principle from ACSM for exercise prescriptions  Reviews F.I.T.T. principles of resistance exercise including progression. Written material given at graduation. Flowsheet Row Cardiac Rehab from 09/05/2022 in [facility] Cardiac and Pulmonary Rehab  Date 07/11/22  Educator NT  Instruction Review Code 1- Verbalizes Understanding  Education: Exercise & Equipment Safety: - Individual verbal instruction and demonstration of equipment use and safety with use of the equipment. Flowsheet Row Cardiac Rehab from 09/05/2022 in [facility] Cardiac and Pulmonary Rehab  Education need identified 07/04/22  Date 07/04/22  Educator [place]  Instruction Review Code 1- Verbalizes Understanding       Education: Exercise Physiology & General Exercise Guidelines: - Group verbal and written instruction with models to review the exercise physiology of the cardiovascular system and associated critical values. Provides  general exercise guidelines with specific guidelines to those with heart or lung disease.  Flowsheet Row Cardiac Rehab from 09/05/2022 in [facility] Cardiac and Pulmonary Rehab  Date 08/29/22  Educator [place]  Instruction Review Code 1- Verbalizes Understanding       Education: Flexibility, Balance, Mind/Body Relaxation: - Group verbal and visual presentation with interactive activity on the components of exercise prescription. Introduces F.I.T.T principle from ACSM for exercise prescriptions. Reviews F.I.T.T. principles of flexibility and balance exercise training including progression. Also discusses the mind body connection.  Reviews various relaxation techniques to help reduce and manage stress (i.e. Deep breathing, progressive muscle relaxation, and visualization). Balance handout provided to take home. Written material given at graduation. Flowsheet Row Cardiac Rehab from 09/05/2022 in [facility] Cardiac and Pulmonary Rehab  Date 07/11/22  Educator NT  Instruction Review Code 1- Verbalizes Understanding       Activity Barriers & Risk Stratification:  Activity Barriers & Cardiac Risk Stratification - 07/04/22 1239       Activity Barriers & Cardiac Risk Stratification   Activity Barriers Back Problems;Joint Problems;Other (comment)    Comments History of fractured hip    Cardiac Risk Stratification High             6 Minute Walk:  6 Minute Walk     Row Name 07/04/22 1253 09/05/22 1411       6 Minute Walk   Phase Initial Discharge    Distance 1145 feet 1590 feet    Distance % Change -- 38.9 %    Distance Feet Change -- 445 ft    Walk Time 6 minutes 6 minutes    # of Rest Breaks 0 0    MPH 2.16 3.01    METS 3.35 4.31    RPE 9 12    Perceived Dyspnea  0 --    VO2 Peak 11.75 15.07    Symptoms Yes (comment) Yes (comment)    Comments Bilateral hip pain 4/10 chronic hip pain, calf pain 4/10    Resting HR 73 bpm 67 bpm    Resting BP 110/66 124/70    Resting Oxygen Saturation  99 %  98 %    Exercise Oxygen Saturation  during 6 min walk 100 % 99 %    Max Ex. HR 87 bpm 95 bpm    Max Ex. BP 120/64 146/74    2 Minute Post BP 112/66 --             Oxygen Initial Assessment:   Oxygen Re-Evaluation:   Oxygen Discharge (Final Oxygen Re-Evaluation):   Initial Exercise Prescription:  Initial Exercise Prescription - 07/04/22 1200       Date of Initial Exercise RX and Referring Provider   Date 07/04/22    Referring Provider Serafina Royals MD      Oxygen   Maintain Oxygen Saturation 88% or higher      Treadmill   MPH 2.2    Grade 1    Minutes 15  METs 2.99      Recumbant Bike   Level 3    RPM 60    Watts 30    Minutes 15    METs 3.3      REL-XR   Level 2    Speed 50    Minutes 15    METs 3.3      T5 Nustep   Level 2    SPM 80    Minutes 15    METs 3.3      Prescription Details   Frequency (times per week) 3    Duration Progress to 30 minutes of continuous aerobic without signs/symptoms of physical distress      Intensity   THRR 40-80% of Max Heartrate 109 - 145    Ratings of Perceived Exertion 11-13    Perceived Dyspnea 0-4      Progression   Progression Continue to progress workloads to maintain intensity without signs/symptoms of physical distress.      Resistance Training   Training Prescription Yes    Weight 3 lb    Reps 10-15             Perform Capillary Blood Glucose checks as needed.  Exercise Prescription Changes:   Exercise Prescription Changes     Row Name 07/04/22 1200 07/23/22 1400 07/25/22 1500 08/07/22 1100 08/20/22 1300     Response to Exercise   Blood Pressure (Admit) 110/66 128/72 -- 118/68 118/64   Blood Pressure (Exercise) 120/64 140/60 -- 160/76 --   Blood Pressure (Exit) 112/66 122/68 -- 132/64 108/60   Heart Rate (Admit) 83 bpm 70 bpm -- 101 bpm 70 bpm   Heart Rate (Exercise) 87 bpm 115 bpm -- 107 bpm 119 bpm   Heart Rate (Exit) 75 bpm 79 bpm -- 82 bpm 78 bpm   Oxygen Saturation (Admit)  99 % -- -- -- --   Oxygen Saturation (Exercise) 100 % -- -- -- --   Oxygen Saturation (Exit) 99 % -- -- -- --   Rating of Perceived Exertion (Exercise) 9 13 -- 13 12   Perceived Dyspnea (Exercise) 0 -- -- -- --   Symptoms bilateral hip pain 4/10 hip pain 5/10 -- hip pain none   Comments walk test results First full week of exercise -- -- --   Duration -- Continue with 30 min of aerobic exercise without signs/symptoms of physical distress. -- Continue with 30 min of aerobic exercise without signs/symptoms of physical distress. Continue with 30 min of aerobic exercise without signs/symptoms of physical distress.   Intensity -- THRR unchanged -- THRR unchanged THRR unchanged     Progression   Progression -- Continue to progress workloads to maintain intensity without signs/symptoms of physical distress. -- Continue to progress workloads to maintain intensity without signs/symptoms of physical distress. Continue to progress workloads to maintain intensity without signs/symptoms of physical distress.   Average METs -- 3.23 -- 3.84 3.59     Resistance Training   Training Prescription -- Yes -- Yes Yes   Weight -- 3 lb -- 3 lb 3 lb   Reps -- 10-15 -- 10-15 10-15     Interval Training   Interval Training -- No -- No No     Treadmill   MPH -- 2.2 -- 2.4 2.5   Grade -- 1.5 -- 1.5 1.5   Minutes -- 15 -- 15 15   METs -- 3.14 -- 3.33 3.43     Recumbant Bike   Level -- 5 --  5 --   Watts -- 33 -- -- --   Minutes -- 15 -- 15 --   METs -- 3.37 -- -- --     REL-XR   Level -- 11 -- 5 5   Minutes -- 15 -- 15 15   METs -- 4.5 -- 5.4 5.5     T5 Nustep   Level -- 4 -- 3 4   Minutes -- 15 -- 15 15   METs -- 2.7 -- 2.8 3     Home Exercise Plan   Plans to continue exercise at -- -- Home (comment)  walking Home (comment)  walking Home (comment)  walking   Frequency -- -- Add 2 additional days to program exercise sessions. Add 2 additional days to program exercise sessions. Add 2 additional days to  program exercise sessions.   Initial Home Exercises Provided -- -- 07/25/22 07/25/22 07/25/22     Oxygen   Maintain Oxygen Saturation -- 88% or higher 88% or higher 88% or higher 88% or higher    Row Name 09/03/22 1000             Response to Exercise   Blood Pressure (Admit) 114/62       Blood Pressure (Exit) 112/62       Heart Rate (Admit) 75 bpm       Heart Rate (Exercise) 103 bpm       Heart Rate (Exit) 75 bpm       Rating of Perceived Exertion (Exercise) 12       Symptoms none       Duration Continue with 30 min of aerobic exercise without signs/symptoms of physical distress.       Intensity THRR unchanged         Progression   Progression Continue to progress workloads to maintain intensity without signs/symptoms of physical distress.       Average METs 3.65         Resistance Training   Training Prescription Yes       Weight 5 lb       Reps 10-15         Interval Training   Interval Training No         Treadmill   MPH 2.6       Grade 1.5       Minutes 15       METs 3.53         Recumbant Elliptical   Level 4       Minutes 15       METs 3.4         REL-XR   Level 5       Minutes 15         T5 Nustep   Level 3       Minutes 15       METs 2.8         Home Exercise Plan   Plans to continue exercise at Home (comment)  walking       Frequency Add 2 additional days to program exercise sessions.       Initial Home Exercises Provided 07/25/22         Oxygen   Maintain Oxygen Saturation 88% or higher                Exercise Comments:   Exercise Goals and Review:   Exercise Goals     Row Name 07/04/22 1257  Exercise Goals   Increase Physical Activity Yes       Intervention Provide advice, education, support and counseling about physical activity/exercise needs.;Develop an individualized exercise prescription for aerobic and resistive training based on initial evaluation findings, risk stratification, comorbidities and  participant's personal goals.       Expected Outcomes Short Term: Attend rehab on a regular basis to increase amount of physical activity.;Long Term: Add in home exercise to make exercise part of routine and to increase amount of physical activity.;Long Term: Exercising regularly at least 3-5 days a week.       Increase Strength and Stamina Yes       Intervention Provide advice, education, support and counseling about physical activity/exercise needs.;Develop an individualized exercise prescription for aerobic and resistive training based on initial evaluation findings, risk stratification, comorbidities and participant's personal goals.       Expected Outcomes Short Term: Increase workloads from initial exercise prescription for resistance, speed, and METs.;Short Term: Perform resistance training exercises routinely during rehab and add in resistance training at home;Long Term: Improve cardiorespiratory fitness, muscular endurance and strength as measured by increased METs and functional capacity (6MWT)       Able to understand and use rate of perceived exertion (RPE) scale Yes       Intervention Provide education and explanation on how to use RPE scale       Expected Outcomes Short Term: Able to use RPE daily in rehab to express subjective intensity level;Long Term:  Able to use RPE to guide intensity level when exercising independently       Able to understand and use Dyspnea scale Yes       Intervention Provide education and explanation on how to use Dyspnea scale       Expected Outcomes Short Term: Able to use Dyspnea scale daily in rehab to express subjective sense of shortness of breath during exertion;Long Term: Able to use Dyspnea scale to guide intensity level when exercising independently       Knowledge and understanding of Target Heart Rate Range (THRR) Yes       Intervention Provide education and explanation of THRR including how the numbers were predicted and where they are located for  reference       Expected Outcomes Short Term: Able to state/look up THRR;Long Term: Able to use THRR to govern intensity when exercising independently;Short Term: Able to use daily as guideline for intensity in rehab       Able to check pulse independently Yes       Intervention Provide education and demonstration on how to check pulse in carotid and radial arteries.;Review the importance of being able to check your own pulse for safety during independent exercise       Expected Outcomes Short Term: Able to explain why pulse checking is important during independent exercise;Long Term: Able to check pulse independently and accurately       Understanding of Exercise Prescription Yes       Intervention Provide education, explanation, and written materials on patient's individual exercise prescription       Expected Outcomes Short Term: Able to explain program exercise prescription;Long Term: Able to explain home exercise prescription to exercise independently                Exercise Goals Re-Evaluation :  Exercise Goals Re-Evaluation     Row Name 07/09/22 1341 07/23/22 1414 07/25/22 1514 08/07/22 1046 08/09/22 1339     Exercise Goal Re-Evaluation  Exercise Goals Review Increase Physical Activity;Able to understand and use rate of perceived exertion (RPE) scale;Knowledge and understanding of Target Heart Rate Range (THRR);Understanding of Exercise Prescription;Increase Strength and Stamina;Able to check pulse independently Increase Physical Activity;Understanding of Exercise Prescription;Increase Strength and Stamina Increase Physical Activity;Understanding of Exercise Prescription;Increase Strength and Stamina;Able to understand and use Dyspnea scale;Able to check pulse independently;Able to understand and use rate of perceived exertion (RPE) scale;Knowledge and understanding of Target Heart Rate Range (THRR) Increase Physical Activity;Increase Strength and Stamina;Understanding of Exercise  Prescription Increase Physical Activity;Increase Strength and Stamina;Understanding of Exercise Prescription   Comments Reviewed RPE and dyspnea scales, THR and program prescription with pt today.  Pt voiced understanding and was given a copy of goals to take home. [name] is off to a good start in rehab. He had an overall average MET level of 3.23 METs. He also increased his incline on the treadmill to 1.5% while maintaining a speed of 2.2 mph. He also improved to level 11 on the XR and level 4 on the T5. We will continue to monitor his progress in the program. Reviewed home exercise with pt today.  Pt plans to walk for exercise.  Reviewed THR, pulse, RPE, sign and symptoms, pulse oximetery and when to call 911 or MD.  Also discussed weather considerations and indoor options.  Pt voiced understanding. [name] is doing well with his rehab.  He is up to level 5 on the XR and 2.4 mph on the treadmill.  We will conitnue to monitor his progress. [name] is doing well in rehab.  He has been doing a little bit of home exercise.  He is staying active with doing things around the house.  He does feel like he is doing better with his strength and stamina.   Expected Outcomes Short: Use RPE daily to regulate intensity. Long: Follow program prescription in THR. Short: Continue to increase workloads. Long: Continue to increase strength and stamina. Short: Structure walk to build up to 30 minutes at once Long: Continue independent exercise at appropriate prescription Short: Increase hand weights Long: Conitnue to improve stamina Short:  Continue to add in more exercise at home Long: Continue to improve stamina.    [place] Name 08/20/22 1326 09/03/22 1014 09/06/22 1402         Exercise Goal Re-Evaluation   Exercise Goals Review Increase Physical Activity;Increase Strength and Stamina;Understanding of Exercise Prescription Increase Physical Activity;Increase Strength and Stamina;Understanding of Exercise Prescription Increase Physical  Activity;Increase Strength and Stamina;Understanding of Exercise Prescription     Comments [name] is doing well in rehab. He recently increased his speed on the treadmill to 2.5 mph while maintaining an incline of 1.5%. He also improved to level 4 on the T5. He has tolerated 3 lb weights for resistance exercise as well, and would benefit from increasing to 4 or 5 lb weights. We will continue to monitor. [name] continues to do well in rehab. He increased his speed on the treadmill to 2.6 mph and is now using 5 lb handweights. He is due for his post 6MWT and we hope to see significant improvement. Will continue to monitor. Warden/ranger next week.  He plans to continue to walk daily after graduation.  He is feeling stronger.     Expected Outcomes Short: Try 4 or 5 lb weights for resistance training. Long: Continue to increase strength and stamina. Short: Improve on post 6MWT Long: Continue to increase overall MET level Continue to exercise independently  Discharge Exercise Prescription (Final Exercise Prescription Changes):  Exercise Prescription Changes - 09/03/22 1000       Response to Exercise   Blood Pressure (Admit) 114/62    Blood Pressure (Exit) 112/62    Heart Rate (Admit) 75 bpm    Heart Rate (Exercise) 103 bpm    Heart Rate (Exit) 75 bpm    Rating of Perceived Exertion (Exercise) 12    Symptoms none    Duration Continue with 30 min of aerobic exercise without signs/symptoms of physical distress.    Intensity THRR unchanged      Progression   Progression Continue to progress workloads to maintain intensity without signs/symptoms of physical distress.    Average METs 3.65      Resistance Training   Training Prescription Yes    Weight 5 lb    Reps 10-15      Interval Training   Interval Training No      Treadmill   MPH 2.6    Grade 1.5    Minutes 15    METs 3.53      Recumbant Elliptical   Level 4    Minutes 15    METs 3.4      REL-XR   Level 5     Minutes 15      T5 Nustep   Level 3    Minutes 15    METs 2.8      Home Exercise Plan   Plans to continue exercise at Home (comment)   walking   Frequency Add 2 additional days to program exercise sessions.    Initial Home Exercises Provided 07/25/22      Oxygen   Maintain Oxygen Saturation 88% or higher             Nutrition:  Target Goals: Understanding of nutrition guidelines, daily intake of sodium <1566m, cholesterol <2033m calories 30% from fat and 7% or less from saturated fats, daily to have 5 or more servings of fruits and vegetables.  Education: All About Nutrition: -Group instruction provided by verbal, written material, interactive activities, discussions, models, and posters to present general guidelines for heart healthy nutrition including fat, fiber, MyPlate, the role of sodium in heart healthy nutrition, utilization of the nutrition label, and utilization of this knowledge for meal planning. Follow up email sent as well. Written material given at graduation. [facility]sheet [facility]  Education need identified 07/04/22  Date 09/05/22  Educator [place] Norris[place]ruction Review Code 1- Verbalizes Understanding       Biometrics:  Pre Biometrics - 07/04/22 1238       Pre Biometrics   Height 5' 11.5" (1.816 m)    Weight 183 lb 14.4 oz (83.4 kg)    Waist Circumference 39 inches    Hip Circumference 41 inches    Waist to Hip Ratio 0.95 %    BMI (Calculated) 25.29    Single Leg Stand 30 seconds             Post Biometrics - 09/05/22 1412        Post  Biometrics   Height 5' 11.5" (1.816 m)    Weight 193 lb (87.5 kg)    Waist Circumference 38.5 inches    Hip Circumference 40 inches    Waist to Hip Ratio 0.96 %    BMI (Calculated) 26.55    Single Leg Stand 30 seconds  Nutrition Therapy Plan and Nutrition Goals:  Nutrition Therapy & Goals - 08/06/22 0804       Nutrition Therapy    Diet Heart healthy, low Na    Drug/Food Interactions Statins/Certain Fruits    Protein (specify units) 65-70g    Fiber 30 grams    Whole Grain Foods 3 servings    Saturated Fats 16 max. grams    Fruits and Vegetables 8 servings/day    Sodium 2 grams      Personal Nutrition Goals   Nutrition Goal ST: review paperwork LT: have at least 5-8 servings of fruits/vegetables per day, make at least have grains eaten whole grains, limit saturated fat <16g/day, limit added sugar <36g/day    Comments 56 y.o. M admitted to cardiac rehab s/p NSTEMI, CABG x 1. PMHx includes OSA, CAD, current smoker. Relevant medications includes vit C, lipitor, melatonin, protonix. B: usually small: eggs, biscuits, pancakes, grits L: chicken, steak, pork D: chinese food, steaks, or hamburgers with some non-starchy vegetables. Drinks: a lot of water or sweet tea (2-3x/day). After getting discharged from the hospital he has cut out redbulls. He reports no major barriers at this time, but would like to learn more about heart healthy eating. Discussed heart healthy eating and recommended changes; [name] would like to review the paperwork and try out some small changes from there.      Intervention Plan   Intervention Prescribe, educate and counsel regarding individualized specific dietary modifications aiming towards targeted core components such as weight, hypertension, lipid management, diabetes, heart failure and other comorbidities.    Expected Outcomes Short Term Goal: Understand basic principles of dietary content, such as calories, fat, sodium, cholesterol and nutrients.;Short Term Goal: A plan has been developed with personal nutrition goals set during dietitian appointment.;Long Term Goal: Adherence to prescribed nutrition plan.             Nutrition Assessments:  MEDIFICTS Score Key: ?70 Need to make dietary changes  40-70 Heart Healthy Diet ? 40 Therapeutic Level Cholesterol Diet  Flowsheet Row Cardiac Rehab from  09/06/2022 in [facility] Cardiac and Pulmonary Rehab  Picture Your Plate Total Score on Admission 37  Picture Your Plate Total Score on Discharge 44      Picture Your Plate Scores: <69 Unhealthy dietary pattern with much room for improvement. 41-50 Dietary pattern unlikely to meet recommendations for good health and room for improvement. 51-60 More healthful dietary pattern, with some room for improvement.  >60 Healthy dietary pattern, although there may be some specific behaviors that could be improved.    Nutrition Goals Re-Evaluation:  Nutrition Goals Re-Evaluation     [place] Name 07/25/22 1400 09/06/22 1404           Goals   Nutrition Goal Dean does not have an appointment with the RD but will need one scheduled. ST: review paperwork LT: have at least 5-8 servings of fruits/vegetables per day, make at least have grains eaten whole grains, limit saturated fat <16g/day, limit added sugar <36g/day      Comment Short: Make appt with RD Long: Follow recommendations by RD [name] has started to make changes to his diet.  He continues to work on portion control.  He has also started to add in more variety and watch his sugar and salt intake.      Expected Outcome -- Continue to follow heart healthy diet               Nutrition Goals Discharge (Final Nutrition Goals Re-Evaluation):  Nutrition Goals Re-Evaluation - 09/06/22 1404       Goals   Nutrition Goal ST: review paperwork LT: have at least 5-8 servings of fruits/vegetables per day, make at least have grains eaten whole grains, limit saturated fat <16g/day, limit added sugar <36g/day    Comment [name] has started to make changes to his diet.  He continues to work on portion control.  He has also started to add in more variety and watch his sugar and salt intake.    Expected Outcome Continue to follow heart healthy diet             Psychosocial: Target Goals: Acknowledge presence or absence of significant depression and/or stress,  maximize coping skills, provide positive support system. Participant is able to verbalize types and ability to use techniques and skills needed for reducing stress and depression.   Education: Stress, Anxiety, and Depression - Group verbal and visual presentation to define topics covered.  Reviews how body is impacted by stress, anxiety, and depression.  Also discusses healthy ways to reduce stress and to treat/manage anxiety and depression.  Written material given at graduation. Flowsheet Row Cardiac Rehab from 09/05/2022 in [facility] Cardiac and Pulmonary Rehab  Date 08/22/22  Educator [place]  Instruction Review Code 1- Verbalizes Understanding       Education: Sleep Hygiene -Provides group verbal and written instruction about how sleep can affect your health.  Define sleep hygiene, discuss sleep cycles and impact of sleep habits. Review good sleep hygiene tips.    Initial Review & Psychosocial Screening:  Initial Psych Review & Screening - 06/26/22 0912       Initial Review   Current issues with None Identified      Family Dynamics   Good Support System? Yes   wife,neice and her child, neighbors, family     Barriers   Psychosocial barriers to participate in program There are no identifiable barriers or psychosocial needs.      Screening Interventions   Interventions Encouraged to exercise;To provide support and resources with identified psychosocial needs;Provide feedback about the scores to participant    Expected Outcomes Short Term goal: Utilizing psychosocial counselor, staff and physician to assist with identification of specific Stressors or current issues interfering with healing process. Setting desired goal for each stressor or current issue identified.;Long Term Goal: Stressors or current issues are controlled or eliminated.;Short Term goal: Identification and review with participant of any Quality of Life or Depression concerns found by scoring the questionnaire.;Long Term goal:  The participant improves quality of Life and PHQ9 Scores as seen by post scores and/or verbalization of changes             Quality of Life Scores:   Quality of Life - 09/06/22 1346       Quality of Life   Select Quality of Life      Quality of Life Scores   Health/Function Pre 20.83 %    Health/Function Post 25 %    Health/Function % Change 20.02 %    Socioeconomic Pre 27.43 %    Socioeconomic Post 25.07 %    Socioeconomic % Change  -8.6 %    Psych/Spiritual Pre 28.29 %    Psych/Spiritual Post 28.07 %    Psych/Spiritual % Change -0.78 %    Family Pre 28.8 %    Family Post 28.8 %    Family % Change 0 %    GLOBAL Pre 24.9 %    GLOBAL Post 26.21 %  GLOBAL % Change 5.26 %            Scores of 19 and below usually indicate a poorer quality of life in these areas.  A difference of  2-3 points is a clinically meaningful difference.  A difference of 2-3 points in the total score of the Quality of Life Index has been associated with significant improvement in overall quality of life, self-image, physical symptoms, and general health in studies assessing change in quality of life.  PHQ-9: Review Flowsheet       09/06/2022  Depression screen PHQ 2/9  Decreased Interest 0  Down, Depressed, Hopeless 0  PHQ - 2 Score 0  Altered sleeping 0  Tired, decreased energy 0  Change in appetite 0  Feeling bad or failure about yourself  0  Trouble concentrating 0  Moving slowly or fidgety/restless 0  Suicidal thoughts 0  PHQ-9 Score 0  Difficult doing work/chores Not difficult at all   Interpretation of Total Score  Total Score Depression Severity:  1-4 = Minimal depression, 5-9 = Mild depression, 10-14 = Moderate depression, 15-19 = Moderately severe depression, 20-27 = Severe depression   Psychosocial Evaluation and Intervention:  Psychosocial Evaluation - 06/26/22 0923       Psychosocial Evaluation & Interventions   Interventions Encouraged to exercise with the program  and follow exercise prescription    Comments Eastyn has no barriers to attending the program. He wants to learn all he can to know what to expect. He lives with his wife. He has 2 grown children. His wife , family, neighbors and friends are his support.  He is ready to get started.    Expected Outcomes STG Jahseh attends all scheduled sessions, he is able to progress with his exercise,despite his back problems. LTG Jacquees is able to continue his progression after discharge    Continue Psychosocial Services  Follow up required by staff             Psychosocial Re-Evaluation:  Psychosocial Re-Evaluation     [place] Name 07/25/22 1406 08/09/22 1343 09/06/22 1402         Psychosocial Re-Evaluation   Current issues with None Identified Current Stress Concerns Current Stress Concerns     Comments [name] is doing well mentally. His wife and kids are good support. His wife just broke her foot and they are busy running her to appointments. He is not taking any medications for depression/ anxiety nor feels that he needs to. There are no big stressors in his life. Denies problem with sleep. He is waiting to get cleared to go back to work which he thinks will be in the next week or 2. He enjoys playing golf and can't wait to get back to that as soon as he is cleared from a sternal precaution standpoint [name] is doing well in rehab.  He is sleeping well.  He still has a good support system.  His biggest stressor and task is to quit smoking.  He continues to work towards that.  He is doing some exercise at home. [name] has done well in rehab.  He continues to work on quitting and it continues to be his biggest stressor.  He still wants to quit.  He continues to sleep well and stays positive.     Expected Outcomes Short: Continue attendning rehab consistently Long: Continue to maintain positive attitude Short: Continue to add exercise for mental boost Long: Conitnue to focus on positive Continue to exercise for mental  boost and stay positive     Interventions Encouraged to attend Cardiac Rehabilitation for the exercise Encouraged to attend Cardiac Rehabilitation for the exercise Encouraged to attend Cardiac Rehabilitation for the exercise     Continue Psychosocial Services  Follow up required by staff Follow up required by staff --              Psychosocial Discharge (Final Psychosocial Re-Evaluation):  Psychosocial Re-Evaluation - 09/06/22 1402       Psychosocial Re-Evaluation   Current issues with Current Stress Concerns    Comments [name] has done well in rehab.  He continues to work on quitting and it continues to be his biggest stressor.  He still wants to quit.  He continues to sleep well and stays positive.    Expected Outcomes Continue to exercise for mental boost and stay positive    Interventions Encouraged to attend Cardiac Rehabilitation for the exercise             Vocational Rehabilitation: Provide vocational rehab assistance to qualifying candidates.   Vocational Rehab Evaluation & Intervention:  Vocational Rehab - 09/06/22 1343       Discharge Vocational Rehab   Discharge Vocational Rehabilitation not required             Education: Education Goals: Education classes will be provided on a variety of topics geared toward better understanding of heart health and risk factor modification. Participant will state understanding/return demonstration of topics presented as noted by education test scores.  Learning Barriers/Preferences:   General Cardiac Education Topics:  AED/CPR: - Group verbal and written instruction with the use of models to demonstrate the basic use of the AED with the basic ABC's of resuscitation.   Anatomy and Cardiac Procedures: - Group verbal and visual presentation and models provide information about basic cardiac anatomy and function. Reviews the testing methods done to diagnose heart disease and the outcomes of the test results. Describes  the treatment choices: Medical Management, Angioplasty, or Coronary Bypass Surgery for treating various heart conditions including Myocardial Infarction, Angina, Valve Disease, and Cardiac Arrhythmias.  Written material given at graduation. Flowsheet Row Cardiac Rehab from 09/05/2022 in [facility] Cardiac and Pulmonary Rehab  Education need identified 07/04/22  Date 07/18/22  Educator [place]  Instruction Review Code 1- Verbalizes Understanding       Medication Safety: - Group verbal and visual instruction to review commonly prescribed medications for heart and lung disease. Reviews the medication, class of the drug, and side effects. Includes the steps to properly store meds and maintain the prescription regimen.  Written material given at graduation. Flowsheet Row Cardiac Rehab from 09/05/2022 in [facility] Cardiac and Pulmonary Rehab  Date 08/01/22  Educator [place]  Instruction Review Code 1- Verbalizes Understanding       Intimacy: - Group verbal instruction through game format to discuss how heart and lung disease can affect sexual intimacy. Written material given at graduation.. Flowsheet Row Cardiac Rehab from 09/05/2022 in [facility] Cardiac and Pulmonary Rehab  Date 09/05/22  Educator [place]  Instruction Review Code 1- Verbalizes Understanding       Know Your Numbers and Heart Failure: - Group verbal and visual instruction to discuss disease risk factors for cardiac and pulmonary disease and treatment options.  Reviews associated critical values for Overweight/Obesity, Hypertension, Cholesterol, and Diabetes.  Discusses basics of heart failure: signs/symptoms and treatments.  Introduces Heart Failure Zone chart for action plan for heart failure.  Written material given at graduation.   Infection Prevention: -  Provides verbal and written material to individual with discussion of infection control including proper hand washing and proper equipment cleaning during exercise session. Flowsheet Row Cardiac  Rehab from 09/05/2022 in [facility] Cardiac and Pulmonary Rehab  Education need identified 07/04/22  Date 07/04/22  Educator [place]  Instruction Review Code 1- Verbalizes Understanding       Falls Prevention: - Provides verbal and written material to individual with discussion of falls prevention and safety. Flowsheet Row Cardiac Rehab from 06/26/2022 in [facility] Cardiac and Pulmonary Rehab  Date 06/26/22  Educator SB  Instruction Review Code 1- Verbalizes Understanding       Other: -Provides group and verbal instruction on various topics (see comments)   Knowledge Questionnaire Score:  Knowledge Questionnaire Score - 09/06/22 1343       Knowledge Questionnaire Score   Pre Score 24/26    Post Score 26/26             Core Components/Risk Factors/Patient Goals at Admission:  Personal Goals and Risk Factors at Admission - 07/04/22 1257       Core Components/Risk Factors/Patient Goals on Admission    Weight Management Yes;Weight Maintenance    Intervention Weight Management: Develop a combined nutrition and exercise program designed to reach desired caloric intake, while maintaining appropriate intake of nutrient and fiber, sodium and fats, and appropriate energy expenditure required for the weight goal.;Weight Management/Obesity: Establish reasonable short term and long term weight goals.;Weight Management: Provide education and appropriate resources to help participant work on and attain dietary goals.    Admit Weight 183 lb (83 kg)    Goal Weight: Short Term 183 lb (83 kg)    Goal Weight: Long Term 183 lb (83 kg)    Expected Outcomes Short Term: Continue to assess and modify interventions until short term weight is achieved;Long Term: Adherence to nutrition and physical activity/exercise program aimed toward attainment of established weight goal;Weight Maintenance: Understanding of the daily nutrition guidelines, which includes 25-35% calories from fat, 7% or less cal from saturated  fats, less than 261m cholesterol, less than 1.5gm of sodium, & 5 or more servings of fruits and vegetables daily;Understanding recommendations for meals to include 15-35% energy as protein, 25-35% energy from fat, 35-60% energy from carbohydrates, less than 2023mof dietary cholesterol, 20-35 gm of total fiber daily;Understanding of distribution of calorie intake throughout the day with the consumption of 4-5 meals/snacks    Tobacco Cessation Yes    Number of packs per day Tyronn is a current tobacco user. Intervention for tobacco cessation was provided at the initial medical review. He was asked about readiness to quit and reported that he has decreased number of cigarettes since surgery. Was at 1 1/4 pack a day. Is currently at 5-7 cigarettes a day, sometimes 10, and is planning to decrease to zero. He is not using anything at this time to help him. He does not have a set quit date . Patient was advised and educated about tobacco cessation using combination therapy, tobacco cessation classes, quit line, and quit smoking apps. Patient demonstrated understanding of this material. Staff will continue to provide encouragement and follow up with the patient throughout the program.    Intervention Assist the participant in steps to quit. Provide individualized education and counseling about committing to Tobacco Cessation, relapse prevention, and pharmacological support that can be provided by physician.;OfAdvice workerassist with locating and accessing local/national Quit Smoking programs, and support quit date choice.    Expected Outcomes Short Term: Will  demonstrate readiness to quit, by selecting a quit date.;Short Term: Will quit all tobacco product use, adhering to prevention of relapse plan.;Long Term: Complete abstinence from all tobacco products for at least 12 months from quit date.    Lipids Yes    Intervention Provide education and support for participant on nutrition &  aerobic/resistive exercise along with prescribed medications to achieve LDL <73m, HDL >472m    Expected Outcomes Short Term: Participant states understanding of desired cholesterol values and is compliant with medications prescribed. Participant is following exercise prescription and nutrition guidelines.;Long Term: Cholesterol controlled with medications as prescribed, with individualized exercise RX and with personalized nutrition plan. Value goals: LDL < 7065mHDL > 40 mg.             Education:Diabetes - Individual verbal and written instruction to review signs/symptoms of diabetes, desired ranges of glucose level fasting, after meals and with exercise. Acknowledge that pre and post exercise glucose checks will be done for 3 sessions at entry of program.   Core Components/Risk Factors/Patient Goals Review:   Goals and Risk Factor Review     Row Name 07/25/22 1401 08/09/22 1340 09/06/22 1406         Core Components/Risk Factors/Patient Goals Review   Personal Goals Review Weight Management/Obesity;Tobacco Cessation Weight Management/Obesity;Tobacco Cessation Weight Management/Obesity;Tobacco Cessation     Review DeaMarlou Sa looking to maintain his weight. He had already lost 10lbs since his heart event but thinks he's gaining it slowly back. He is aware to be mindful of sudden weight gain in a short period of time. He is still smoking 8-10 cigarettes/ day but is still wanting to quit. He is not interested in trying the nicotine patches or gum. We talked about using fake ciagerettes to help with his oral fixation. He has not quite set a quit date but was encouraged to do so. We hope to see his # of cigarettes overtime start to decrease DeaMarlou Sa doing well in rehab.  His weight has crept up some but he is hoping to hold steady.  He is still around 8 cigarettes a day.  He is starting to associate them with certain habits so starting to not crave as a fix so much.  He is doing well with his  medications. DeaMarlou Santinues to work on cessation.  He is still at 1/2 ppd but wants to quit.  His weight conitnues to hold steady     Expected Outcomes Short: Work on smoking cessation Long: Continue long-term abstinence of tobacco Short; Continue to work toward cessation Long: continue to abstain. Continue to work on cessation              Core Components/Risk Factors/Patient Goals at Discharge (Final Review):   Goals and Risk Factor Review - 09/06/22 1406       Core Components/Risk Factors/Patient Goals Review   Personal Goals Review Weight Management/Obesity;Tobacco Cessation    Review DeaMarlou Santinues to work on cessation.  He is still at 1/2 ppd but wants to quit.  His weight conitnues to hold steady    Expected Outcomes Continue to work on cessation             ITP Comments:  ITP Comments     Row Name 06/26/22 0922 06/26/22 0932 07/04/22 1222 07/09/22 1340 08/01/22 0709   ITP Comments Virtual orientation call completed today. he has an appointment on Date: 07/04/2022  for EP eval and gym Orientation.  [facility]  Date: 05/29/2022 . Shrihaan is a current tobacco user. Intervention for tobacco cessation was provided at the initial medical review. He was asked about readiness to quit and reported that he has decreased number of cigarettes since surgery. Was at 1 1/4 pack a day. Is currently at 5-7 cigarettes a day and is planning to decrease to zero. He does not have a set quit date . Patient was advised and educated about tobacco cessation using combination therapy, tobacco cessation classes, quit line, and quit smoking apps. Patient demonstrated understanding of this material. Staff will continue to provide encouragement and follow up with the patient throughout the program. Completed 6MWT and gym orientation. Initial ITP created and sent for review to Dr. Emily Filbert, Medical Director. First full day of exercise!  Patient was oriented to gym and  equipment including functions, settings, policies, and procedures.  Patient's individual exercise prescription and treatment plan were reviewed.  All starting workloads were established based on the results of the 6 minute walk test done at initial orientation visit.  The plan for exercise progression was also introduced and progression will be customized based on patient's performance and goals. 30 Day review completed. Medical Director ITP review done, changes made as directed, and signed approval by Medical Director.   New to program    Row Name 08/06/22 1429 08/29/22 0925 09/12/22 1355       ITP Comments Completed initial RD consultation 30 Day review completed. Medical Director ITP review done, changes made as directed, and signed approval by Medical Director. Guenther graduated today from  rehab with 36 sessions completed.  Details of the patient's exercise prescription and what She needs to do in order to continue the prescription and progress were discussed with patient.  Patient was given a copy of prescription and goals.  Patient verbalized understanding.  Carrick plans to continue to exercise by walking.              Comments: discharge ITP

## 2022-10-08 ENCOUNTER — Encounter: Payer: Self-pay | Admitting: Cardiothoracic Surgery

## 2022-10-08 ENCOUNTER — Ambulatory Visit (INDEPENDENT_AMBULATORY_CARE_PROVIDER_SITE_OTHER): Payer: BC Managed Care – PPO | Admitting: Cardiothoracic Surgery

## 2022-10-08 VITALS — BP 125/79 | HR 67 | Resp 20 | Ht 71.0 in | Wt 199.0 lb

## 2022-10-08 DIAGNOSIS — Z951 Presence of aortocoronary bypass graft: Secondary | ICD-10-CM

## 2022-10-08 DIAGNOSIS — Z87828 Personal history of other (healed) physical injury and trauma: Secondary | ICD-10-CM

## 2022-10-08 NOTE — Progress Notes (Signed)
HPI: Patient returns for postop surgical visit after having emergency CABG left IMA to LAD in [facility] late July 2023.  He lives in [place] and has been followed by Dr. Gwen Pounds for cardiology and through this office since his return home.  He continues to do well without recurrent symptoms.  All incisions are well-healed.  His chest x-ray is clear.  He recently finished the extended cardiac rehab program at [facility].  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  He is compliant with his meds and diet however still smokes about 5 cigarettes daily.  His cardiologist Dr. Arnoldo Hooker is retiring and the patient will be referred to the [facility] cardiology group in [place].  Current Outpatient Medications  Medication Sig Dispense Refill   albuterol (VENTOLIN HFA) 108 (90 Base) MCG/ACT inhaler      ascorbic acid (VITAMIN C) 500 MG tablet Take by mouth.     aspirin EC 81 MG tablet Take by mouth.     atorvastatin (LIPITOR) 20 MG tablet Take 80 mg by mouth.     clopidogrel (PLAVIX) 75 MG tablet Take by mouth.     fluticasone (FLONASE ALLERGY RELIEF) 50 MCG/ACT nasal spray Place into both nostrils daily as needed for allergies or rhinitis.     melatonin 5 MG TABS 1 tablet in the evening Orally Once a day     metoprolol tartrate (LOPRESSOR) 25 MG tablet Take by mouth.     pantoprazole (PROTONIX) 40 MG tablet Take by mouth.     No current facility-administered medications for this visit.     Physical Exam: Blood pressure 125/79, pulse 67, resp. rate 20, height 5\' 11"  (1.803 m), weight 199 lb (90.3 kg), SpO2 98 %.         Exam    General- alert and comfortable.  Sternal wound well-healed.    Neck- no JVD, no cervical adenopathy palpable, no carotid bruit   Lungs- clear without rales, wheezes   Cor- regular rate and rhythm, no murmur , gallop   Abdomen- soft, non-tender   Extremities - warm, non-tender, minimal edema   Neuro- oriented, appropriate, no  focal weakness  Diagnostic Tests: none  Impression: Excellent recovery after emergency CABG x 1 in July 2023. The patient is recommended to continue to follow heart healthy lifestyle and to continue his current meds and to stop smoking. He will be referred to Dr. August 2023 End at the [facility] cardiology office in [place]. Plan: Return as needed.   [place], MD [facility] [phone]

## 2022-12-05 ENCOUNTER — Ambulatory Visit: Payer: BLUE CROSS/BLUE SHIELD | Admitting: Internal Medicine

## 2023-02-06 ENCOUNTER — Ambulatory Visit: Payer: Self-pay | Admitting: Internal Medicine

## 2023-03-06 ENCOUNTER — Encounter: Payer: Self-pay | Admitting: Internal Medicine

## 2023-03-06 ENCOUNTER — Ambulatory Visit: Payer: 59 | Attending: Internal Medicine | Admitting: Internal Medicine

## 2023-03-06 VITALS — BP 110/60 | HR 66 | Ht 71.0 in | Wt 200.0 lb

## 2023-03-06 DIAGNOSIS — I2510 Atherosclerotic heart disease of native coronary artery without angina pectoris: Secondary | ICD-10-CM | POA: Diagnosis not present

## 2023-03-06 DIAGNOSIS — Z951 Presence of aortocoronary bypass graft: Secondary | ICD-10-CM

## 2023-03-06 DIAGNOSIS — M791 Myalgia, unspecified site: Secondary | ICD-10-CM

## 2023-03-06 DIAGNOSIS — R413 Other amnesia: Secondary | ICD-10-CM

## 2023-03-06 MED ORDER — NITROGLYCERIN 0.4 MG SL SUBL: 0.4000 mg | SUBLINGUAL_TABLET | SUBLINGUAL | 3 refills | Status: DC | PRN

## 2023-03-06 NOTE — Patient Instructions (Signed)
Medication Instructions:  For as needed Nitroglycerin, if you develop chest pain: Sit and rest 5 minutes. If chest pain does not resolve place 1 nitroglycerin under your tongue and wait 5 minutes. If chest pain does not resolve, place a 2nd nitroglycerin under your tongue and wait 5 more minutes. If chest pain does not resolve, place a 3rd nitroglycerin under your tongue and seek emergency services.    Hold Atorvastatin (Lipitor) for 4 weeks. Please call our office for an update regarding your symptoms.    *If you need a refill on your cardiac medications before your next appointment, please call your pharmacy*   Lab Work: None ordered today   Testing/Procedures: None ordered today   Follow-Up: At [facility], you and your health needs are our priority.  As part of our continuing mission to provide you with exceptional heart care, we have created designated Provider Care Teams.  These Care Teams include your primary Cardiologist (physician) and Advanced Practice Providers (APPs -  Physician Assistants and Nurse Practitioners) who all work together to provide you with the care you need, when you need it.  We recommend signing up for the patient portal called "MyChart".  Sign up information is provided on this After Visit Summary.  MyChart is used to connect with patients for Virtual Visits (Telemedicine).  Patients are able to view lab/test results, encounter notes, upcoming appointments, etc.  Non-urgent messages can be sent to your provider as well.   To learn more about what you can do with MyChart, go to [email or web address].    Your next appointment:   3 month(s)  Provider:   You may see Yvonne Kendall, MD or one of the following Advanced Practice Providers on your designated Care Team:   Nicolasa Ducking, NP Eula Listen, PA-C Cadence Fransico Michael, PA-C Charlsie Quest, NP

## 2023-03-06 NOTE — Progress Notes (Signed)
New Outpatient Visit Date: 03/06/2023  Primary Care Provider: Garlan Fillers, MD [address] [place] [address]  Chief Complaint: Establish cardiology care  HPI:  Garrett Park is a 57 y.o. male who is being seen today for the evaluation of coronary artery disease and obstructive sleep apnea.  He was previously followed by Dr. Gwen Pounds at [facility]. He has a history of coronary artery disease status post CABG single-vessel CABG in [place], [place].  Leading up to his CABG, Garrett Park reports that he has been feeling fairly well.  However, while vacationing in [place] he developed chest pain after eating pizza.  He initially thought this was heartburn though it did not resolve with antacids.  He subsequently took a sublingual nitroglycerin from his brother-in-law with prompt resolution of the pain.  He presented to a local ED from where he was subsequently transferred to transfer to [facility] in [place].  He underwent cardiac catheterization and subsequent single-vessel CABG (presumably LIMA to LAD) on 06/01/2022.  Today, Garrett Park reports that he has been feeling fairly well.  His median sternotomy has healed well.  He denies chest pain, shortness of breath, palpitations, lightheadedness, and edema.  1 concern is fogginess and decreased memory following his bypass.  It seems to be improving very gradually.  He has also noted some muscle cramps and stiffness.  He has not been exercising the last few months due to work as well as seasonal allergies.  --------------------------------------------------------------------------------------------------  Cardiovascular History & Procedures: Cardiovascular Problems: Coronary artery disease status post CABG  Risk Factors: Known coronary artery disease, tobacco use, and male gender  Cath/PCI: 05/2022 (details not available)  CV Surgery: CABG (06/01/2022, [place], [place]): LIMA-LAD (presumed)  EP Procedures and  Devices: None  Non-Invasive Evaluation(s): None available  Recent CV Pertinent Labs: Lab Results  Component Value Date   INR 0.91 01/29/2017   K 3.9 01/31/2017   BUN 11 01/31/2017   CREATININE 1.08 01/31/2017    --------------------------------------------------------------------------------------------------  Past Medical History:  Diagnosis Date   Anxiety yrs ago   Asthma    Chronic low back pain    Coronary artery disease    Dyslipidemia    GERD (gastroesophageal reflux disease)    Heat rash    both arms and chest   Neck pain    OSA (obstructive sleep apnea)    intolerant of cpap   OSA and COPD overlap syndrome (HCC) 01/09/2016   uses online detal device occ   Peyronie disease     Past Surgical History:  Procedure Laterality Date   COLONOSCOPY     CORONARY ARTERY BYPASS GRAFT  06/01/2022   LIMA-LAD   ESOPHAGOGASTRODUODENOSCOPY     HIP PINNING,CANNULATED Right 01/30/2017   Procedure: CANNULATED HIP PINNING;  Surgeon: Deeann Saint, MD;  Location: [facility];  Service: Orthopedics;  Laterality: Right;   NESBIT PROCEDURE N/A 04/08/2020   Procedure: NESBIT PROCEDURE, PENILE BLOCK;  Surgeon: Sebastian Ache, MD;  Location: [facility];  Service: Urology;  Laterality: N/A;  1 HR    Current Meds  Medication Sig   albuterol (VENTOLIN HFA) 108 (90 Base) MCG/ACT inhaler    aspirin EC 81 MG tablet Take by mouth.   atorvastatin (LIPITOR) 20 MG tablet Take 80 mg by mouth.   clopidogrel (PLAVIX) 75 MG tablet Take by mouth.   fluticasone (FLONASE ALLERGY RELIEF) 50 MCG/ACT nasal spray Place into both nostrils daily as needed for allergies or rhinitis.   melatonin 5 MG  TABS 1 tablet in the evening Orally Once a day   metoprolol tartrate (LOPRESSOR) 25 MG tablet Take by mouth.   pantoprazole (PROTONIX) 40 MG tablet Take by mouth.    Allergies: Pollen extract  Social History   Tobacco Use   Smoking status: Every Day    Packs/day: 1.00    Years: 40.00     Additional pack years: 0.00    Total pack years: 40.00    Types: Cigarettes   Smokeless tobacco: Never   Tobacco comments:    IS working on quitting. Down to 5-7 cigarettes a day . 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   No quit date set.   Vaping Use   Vaping Use: Some days   Substances: Nicotine  Substance Use Topics   Alcohol use: Not Currently    Comment: 1 beer/year   Drug use: No    Family History  Problem Relation Age of Onset   Dementia Mother    Hyperlipidemia Father    Heart disease Father 4       CABG   Diabetes Mellitus II Father    COPD Father    Prostate cancer Brother     Review of Systems: A 12-system review of systems was performed and was negative except as noted in the HPI.  --------------------------------------------------------------------------------------------------  Physical Exam: BP 110/60 (BP Location: Right Arm, Patient Position: Sitting, Cuff Size: Normal)   Pulse 66   Ht 5\' 11"  (1.803 m)   Wt 200 lb (90.7 kg)   SpO2 97%   BMI 27.89 kg/m   General: NAD.  Accompanied by his wife. HEENT: No conjunctival pallor or scleral icterus. Neck: Supple without lymphadenopathy, thyromegaly, JVD, or HJR. No carotid bruit. Lungs: Normal work of breathing. Clear to auscultation bilaterally without wheezes or crackles. Heart: Regular rate and rhythm without murmurs, rubs, or gallops. Non-displaced PMI. Abd: Bowel sounds present. Soft, NT/ND without hepatosplenomegaly Ext: No lower extremity edema. Radial, PT, and DP pulses are 2+ bilaterally Skin: Warm and dry without rash. Neuro: CNIII-XII intact. Strength and fine-touch sensation intact in upper and lower extremities bilaterally. Psych: Normal mood and affect.  EKG: Normal sinus rhythm without abnormalities.  Lab Results  Component Value Date   WBC 7.9 01/31/2017   HGB 14.1 01/31/2017   HCT 41.0 01/31/2017   MCV 96.5 01/31/2017   PLT 132 (L) 01/31/2017    Lab Results  Component Value  Date   NA 136 01/31/2017   K 3.9 01/31/2017   CL 103 01/31/2017   CO2 25 01/31/2017   BUN 11 01/31/2017   CREATININE 1.08 01/31/2017   GLUCOSE 112 (H) 01/31/2017   ALT 47 01/29/2017    --------------------------------------------------------------------------------------------------  ASSESSMENT AND PLAN: Coronary artery disease and hyperlipidemia: Garrett Park has recovered well from his presumed NSTEMI and single-vessel CABG in 05/2022.  We will request records from [facility] in order to confirm his CABG anatomy and better understand his coronary artery disease.  Continue aggressive secondary prevention including 12 months of dual antiplatelet therapy with aspirin and clopidogrel.  Prescription for sublingual nitroglycerin has been provided.  Myalgias and memory impairment: Garrett Park complains of some memory impairment and "fogginess" following his CABG.  He has also experienced some muscle aches and cramping.  We have agreed to a 1 month statin holiday to see if that helps improve his symptoms.  If either issue improves, will need to consider challenging him with an alternative statin versus PCSK9 inhibitor.  Follow-up: Return to  clinic in 3 months.  Yvonne Kendall, MD 03/06/2023 10:24 AM

## 2023-03-08 ENCOUNTER — Telehealth: Payer: Self-pay

## 2023-03-08 ENCOUNTER — Encounter: Payer: Self-pay | Admitting: Internal Medicine

## 2023-03-08 DIAGNOSIS — I2510 Atherosclerotic heart disease of native coronary artery without angina pectoris: Secondary | ICD-10-CM | POA: Insufficient documentation

## 2023-03-08 NOTE — Telephone Encounter (Signed)
Records requested from [facility]

## 2023-03-11 LAB — LAB REPORT - SCANNED: EGFR: 62.2

## 2023-04-04 ENCOUNTER — Telehealth: Payer: Self-pay | Admitting: Internal Medicine

## 2023-04-04 DIAGNOSIS — I2510 Atherosclerotic heart disease of native coronary artery without angina pectoris: Secondary | ICD-10-CM

## 2023-04-04 DIAGNOSIS — Z951 Presence of aortocoronary bypass graft: Secondary | ICD-10-CM

## 2023-04-04 NOTE — Telephone Encounter (Signed)
Pt c/o medication issue:  1. Name of Medication: atorvastatin (LIPITOR) 20 MG tablet   2. How are you currently taking this medication (dosage and times per day)? Not currently taking   3. Are you having a reaction (difficulty breathing--STAT)?   4. What is your medication issue? Side effects he was experiencing have improved since stopping medication.

## 2023-04-07 NOTE — Telephone Encounter (Signed)
Given improvement in myalgias with statin holiday, I recommend that Mr. Garrett Park not take any further atorvastatin and instead begin rosuvastatin 5 mg daily.  We should check a fasting lipid panel and ALT in about 3 months to assess his response.  If he has return of his myalgias, he should let us know.  Yvonne Kendall, MD [facility]

## 2023-04-08 MED ORDER — ROSUVASTATIN CALCIUM 5 MG PO TABS: 5.0000 mg | ORAL_TABLET | Freq: Every day | ORAL | 3 refills | Status: DC

## 2023-04-08 NOTE — Addendum Note (Signed)
Addended by: Rocky Link A on: 04/08/2023 08:35 AM   Modules accepted: Orders

## 2023-04-08 NOTE — Telephone Encounter (Signed)
Spoke with patient and informed him of the provider's recommendations as follows:  "Given improvement in myalgias with statin holiday, I recommend that [name] not take any further atorvastatin and instead begin rosuvastatin 5 mg daily.  We should check a fasting lipid panel and ALT in about 3 months to assess his response.  If he has return of his myalgias, he should let us know.   Yvonne Kendall, MD [facility]"  Patient understood with read back

## 2023-06-19 ENCOUNTER — Ambulatory Visit: Payer: No Typology Code available for payment source | Attending: Internal Medicine | Admitting: Internal Medicine

## 2023-06-19 ENCOUNTER — Encounter: Payer: Self-pay | Admitting: Internal Medicine

## 2023-06-19 VITALS — BP 110/60 | HR 70 | Ht 70.5 in | Wt 200.5 lb

## 2023-06-19 DIAGNOSIS — Z79899 Other long term (current) drug therapy: Secondary | ICD-10-CM

## 2023-06-19 DIAGNOSIS — E785 Hyperlipidemia, unspecified: Secondary | ICD-10-CM

## 2023-06-19 DIAGNOSIS — M79605 Pain in left leg: Secondary | ICD-10-CM

## 2023-06-19 DIAGNOSIS — Z720 Tobacco use: Secondary | ICD-10-CM

## 2023-06-19 DIAGNOSIS — M79604 Pain in right leg: Secondary | ICD-10-CM

## 2023-06-19 DIAGNOSIS — I2510 Atherosclerotic heart disease of native coronary artery without angina pectoris: Secondary | ICD-10-CM | POA: Diagnosis not present

## 2023-06-19 DIAGNOSIS — M79606 Pain in leg, unspecified: Secondary | ICD-10-CM

## 2023-06-19 MED ORDER — VARENICLINE TARTRATE (STARTER) 0.5 MG X 11 & 1 MG X 42 PO TBPK: ORAL_TABLET | ORAL | 0 refills | Status: DC

## 2023-06-19 NOTE — Patient Instructions (Addendum)
Return to clinic in 3 months.  Medication Instructions:  Your physician recommends the following medication changes.  STOP TAKING: Plavix 75 mg by mouth daily  START TAKING: Chantix Take 0.5 mg by mouth daily for 3 days, then increase to 0.5 mg twice a day for 4 days, then 1 mg twice a day thereafter   Lab Work: Your provider would like for you to return to have the following labs drawn: (Lipid, ALT).   Please go to [facility] [address] ([address], [place] [address] You do not need an appointment.  They are open from 7:30 am-4 pm.  Lunch from 1:00 pm- 2:00 pm You will need to be fasting.   You may also go to any of these [facility] locations:  [employer]  - [address] [employer] [address] ([facility])    .   Testing/Procedures: Your physician has requested that you have a lower extremity arterial duplex. During this test, ultrasound is used to evaluate arterial blood flow in the legs. Allow one hour for this exam. There are no restrictions or special instructions. This will take place at [address] ([facility] [address], [place] [address]  Your physician has requested that you have an ankle brachial index (ABI). During this test an ultrasound and blood pressure cuff are used to evaluate the arteries that supply the arms and legs with blood.  Allow thirty minutes for this exam.  There are no restrictions or special instructions.  This will take place at [address] ([facility] [address]) [address], [place] [address]   Follow-Up: At [facility], you and your health needs are our priority.  As part of our continuing mission to provide you with exceptional heart care, we have created designated Provider Care Teams.  These Care Teams include your primary Cardiologist (physician) and Advanced Practice Providers (APPs -  Physician Assistants and Nurse Practitioners) who all work together to provide you with the  care you need, when you need it.  We recommend signing up for the patient portal called "MyChart".  Sign up information is provided on this After Visit Summary.  MyChart is used to connect with patients for Virtual Visits (Telemedicine).  Patients are able to view lab/test results, encounter notes, upcoming appointments, etc.  Non-urgent messages can be sent to your provider as well.   To learn more about what you can do with MyChart, go to [email or web address].    Your next appointment:   3 month(s)  Provider:   You may see Yvonne Kendall, MD or one of the following Advanced Practice Providers on your designated Care Team:   Nicolasa Ducking, NP Eula Listen, PA-C Cadence Fransico Michael, PA-C Charlsie Quest, NP

## 2023-06-19 NOTE — Progress Notes (Signed)
Cardiology Office Note:  .   Date:  06/19/2023  ID:  Garrett Park, DOB [birth year], MRN [id number] PCP: Garlan Fillers, MD  [facility] Providers Cardiologist:  Yvonne Kendall, MD     History of Present Illness: .   Garrett Park is a 57 y.o. male with history of coronary artery disease status post single-vessel CABG (off-pump LIMA-LAD) in [place], [place] in 05/2022, and obstructive sleep apnea.  I met him in May, at which time he transitioned his care from [place] [facility] to our practice following Dr. Philemon Kingdom departure.  He was feeling well at our visit, though he still noted some fogginess and memory impairment after his CABG. he also had some myalgias prompting [place] to pursue a statin holiday.  Today, Garrett Park reports that he is feeling somewhat better.  His "fogginess" is less pronounced though it has not completely resolved.  He notes that his leg cramping and thinking were better with his statin holiday.  He is now back on rosuvastatin and has noticed return of his leg cramps, albeit less severe than when he was on atorvastatin.  Garrett Park has not had any chest pain, shortness of breath, palpitations, lightheadedness, or edema.  He notes occasional pain in his hips and buttocks radiating to both legs if he walks or exercises.  He continues to smoke but would like to try Chantix (this was previously prescribed by his PCP though he never filled the prescription).  ROS: See HPI  Studies Reviewed: Marland Kitchen        TTE (05/31/2022): Normal LV size.  LVEF 55-60%.  Normal RV size and function.  No significant valvular disease.  LHC (05/30/2022, [facility] [place]): LMCA normal.  LAD with thrombotic 80% proximal stenosis.  LCx with 30% proximal stenosis.  Dominant RCA without significant disease.  Normal LVEDP.  Risk Assessment/Calculations:             Physical Exam:   VS:  BP 110/60 (BP Location: Left Arm, Patient Position: Sitting, Cuff Size: Normal)   Pulse 70   Ht 5'  10.5" (1.791 m)   Wt 200 lb 8 oz (90.9 kg)   SpO2 96%   BMI 28.36 kg/m    Wt Readings from Last 3 Encounters:  06/19/23 200 lb 8 oz (90.9 kg)  03/06/23 200 lb (90.7 kg)  10/08/22 199 lb (90.3 kg)    General:  NAD. Neck: No JVD or HJR. Lungs: Clear to auscultation bilaterally without wheezes or crackles. Heart: Regular rate and rhythm without murmurs, rubs, or gallops. Abdomen: Soft, nontender, nondistended. Extremities: No lower extremity edema.  ASSESSMENT AND PLAN: .    Coronary artery disease and hyperlipidemia: No further angina reported, now 1 year out from his NSTEMI and urgent single-vessel CABG (LIMA to LAD).  We will discontinue clopidogrel and continue indefinite aspirin 81 mg daily.  Continue statin therapy as tolerated, though we will have a low threshold for transitioning to a PCSK9 inhibitor or bempedoic acid if his leg discomfort continues/worsens.  We will plan to check a lipid panel and ALT when he returns for his lower extremity vascular studies.  Leg pain: Predominantly affecting the buttocks and hips, though some crampiness in the feet also reported.  Given risk factors for PAD including CAD and smoking, we will pursue ABIs and lower extremity arterial Dopplers at his convenience.  Tobacco abuse: The patient was counseled on tobacco cessation today for 10 minutes.  Counseling included reviewing the risks of smoking tobacco  products, how it impacts the patient's current medical diagnoses and different strategies for quitting.  Pharmacotherapy to aid in tobacco cessation was prescribed today.  We have agreed to a trial of Chantix.  We discussed potential risks, including reports of potential contamination of Chantix carcinogenic contaminants in the past.  We feel the benefits outweigh risks at this time.    Dispo: Return to clinic in 3 months.  Signed, Yvonne Kendall, MD

## 2023-06-20 ENCOUNTER — Telehealth: Payer: Self-pay | Admitting: Internal Medicine

## 2023-06-20 NOTE — Telephone Encounter (Signed)
-----   Message from Nurse Kathrene Alu sent at 06/19/2023  5:08 PM EDT ----- Please call to schedule pt for an ABI, LEA (in the morning) and a 3 month follow up.  Thanks

## 2023-06-20 NOTE — Telephone Encounter (Signed)
Left message on voice mail to schedule

## 2023-06-21 ENCOUNTER — Encounter: Payer: Self-pay | Admitting: Internal Medicine

## 2023-06-21 DIAGNOSIS — M79604 Pain in right leg: Secondary | ICD-10-CM | POA: Insufficient documentation

## 2023-06-21 DIAGNOSIS — E785 Hyperlipidemia, unspecified: Secondary | ICD-10-CM | POA: Insufficient documentation

## 2023-07-23 ENCOUNTER — Ambulatory Visit (INDEPENDENT_AMBULATORY_CARE_PROVIDER_SITE_OTHER): Payer: No Typology Code available for payment source

## 2023-07-23 ENCOUNTER — Ambulatory Visit: Payer: No Typology Code available for payment source | Attending: Internal Medicine

## 2023-07-23 DIAGNOSIS — M79605 Pain in left leg: Secondary | ICD-10-CM

## 2023-07-23 DIAGNOSIS — M79604 Pain in right leg: Secondary | ICD-10-CM | POA: Diagnosis not present

## 2023-07-23 LAB — VAS US ABI WITH/WO TBI
Left ABI: 0.97
Right ABI: 0.89

## 2023-07-24 LAB — LIPID PANEL
Chol/HDL Ratio: 4.6 ratio (ref 0.0–5.0)
Cholesterol, Total: 115 mg/dL (ref 100–199)
HDL: 25 mg/dL — ABNORMAL LOW (ref >39)
LDL Chol Calc (NIH): 70 mg/dL (ref 0–99)
Triglycerides: 109 mg/dL (ref 0–149)
VLDL Cholesterol Cal: 20 mg/dL (ref 5–40)

## 2023-07-24 LAB — ALT: ALT: 47 IU/L — ABNORMAL HIGH (ref 0–44)

## 2023-07-25 ENCOUNTER — Other Ambulatory Visit: Payer: Self-pay

## 2023-07-25 DIAGNOSIS — Z79899 Other long term (current) drug therapy: Secondary | ICD-10-CM

## 2023-07-25 MED ORDER — ROSUVASTATIN CALCIUM 10 MG PO TABS: 10.0000 mg | ORAL_TABLET | Freq: Every day | ORAL | 3 refills | Status: DC

## 2023-08-22 ENCOUNTER — Other Ambulatory Visit: Payer: Self-pay | Admitting: Internal Medicine

## 2023-08-22 DIAGNOSIS — I739 Peripheral vascular disease, unspecified: Secondary | ICD-10-CM

## 2023-09-03 ENCOUNTER — Other Ambulatory Visit: Payer: Self-pay

## 2023-09-03 DIAGNOSIS — Z79899 Other long term (current) drug therapy: Secondary | ICD-10-CM

## 2023-09-04 LAB — LIPID PANEL
Chol/HDL Ratio: 4.9 ratio (ref 0.0–5.0)
Cholesterol, Total: 112 mg/dL (ref 100–199)
HDL: 23 mg/dL — ABNORMAL LOW (ref >39)
LDL Chol Calc (NIH): 66 mg/dL (ref 0–99)
Triglycerides: 126 mg/dL (ref 0–149)
VLDL Cholesterol Cal: 23 mg/dL (ref 5–40)

## 2023-09-04 LAB — ALT: ALT: 32 [IU]/L (ref 0–44)

## 2023-09-18 ENCOUNTER — Ambulatory Visit: Payer: No Typology Code available for payment source | Attending: Internal Medicine | Admitting: Internal Medicine

## 2023-09-18 ENCOUNTER — Encounter: Payer: Self-pay | Admitting: Internal Medicine

## 2023-09-18 VITALS — BP 131/78 | HR 63 | Ht 70.0 in | Wt 199.6 lb

## 2023-09-18 DIAGNOSIS — G720 Drug-induced myopathy: Secondary | ICD-10-CM

## 2023-09-18 DIAGNOSIS — Z720 Tobacco use: Secondary | ICD-10-CM

## 2023-09-18 DIAGNOSIS — I2510 Atherosclerotic heart disease of native coronary artery without angina pectoris: Secondary | ICD-10-CM

## 2023-09-18 DIAGNOSIS — T466X5D Adverse effect of antihyperlipidemic and antiarteriosclerotic drugs, subsequent encounter: Secondary | ICD-10-CM

## 2023-09-18 DIAGNOSIS — E785 Hyperlipidemia, unspecified: Secondary | ICD-10-CM

## 2023-09-18 DIAGNOSIS — Z79899 Other long term (current) drug therapy: Secondary | ICD-10-CM

## 2023-09-18 DIAGNOSIS — I739 Peripheral vascular disease, unspecified: Secondary | ICD-10-CM

## 2023-09-18 DIAGNOSIS — Z951 Presence of aortocoronary bypass graft: Secondary | ICD-10-CM | POA: Diagnosis not present

## 2023-09-18 MED ORDER — REPATHA SURECLICK 140 MG/ML ~~LOC~~ SOAJ: 140.0000 mg | SUBCUTANEOUS | 2 refills | Status: DC

## 2023-09-18 NOTE — Patient Instructions (Addendum)
Medication Instructions:   Your physician has recommended you make the following change in your medication:   -START Repatha 140 mg/ml Take 2 ML injection every 14 days.  -STOP Crestor once you have picked up your Repatha from your local pharmacy.  -STOP Chantix.    *If you need a refill on your cardiac medications before your next appointment, please call your pharmacy*   Lab Work:  Your provider would like for you to return in 3 MONTHS to have the following labs drawn: BMET/LIPID.   - You will need to be fasting. Please do not have anything to eat or drink after midnight the morning you have the lab work. You may only have water or black coffee with no cream or sugar.   Please go to [facility] [address] ([facility]) [address], [place] [address] You do not need an appointment.  They are open from 7:30 am-4 pm.  Lunch from 1:00 pm- 2:00 pm You WILL need to be fasting.    If you have labs (blood work) drawn today and your tests are completely normal, you will receive your results only by: MyChart Message (if you have MyChart) OR A paper copy in the mail If you have any lab test that is abnormal or we need to change your treatment, we will call you to review the results.   Testing/Procedures:  NONE   Follow-Up: At [facility], you and your health needs are our priority.  As part of our continuing mission to provide you with exceptional heart care, we have created designated Provider Care Teams.  These Care Teams include your primary Cardiologist (physician) and Advanced Practice Providers (APPs -  Physician Assistants and Nurse Practitioners) who all work together to provide you with the care you need, when you need it.  We recommend signing up for the patient portal called "MyChart".  Sign up information is provided on this After Visit Summary.  MyChart is used to connect with patients for Virtual Visits (Telemedicine).  Patients are  able to view lab/test results, encounter notes, upcoming appointments, etc.  Non-urgent messages can be sent to your provider as well.   To learn more about what you can do with MyChart, go to [email or web address].    Your next appointment:   6 month(s)  Provider:   You may see Yvonne Kendall, MD or one of the following Advanced Practice Providers on your designated Care Team:   Nicolasa Ducking, NP Eula Listen, PA-C Cadence Fransico Michael, PA-C Charlsie Quest, NP Carlos Levering, NP   Other Instructions  Follow up with Dr. Kirke Corin in the next couple of weeks for PAD Referral.

## 2023-09-18 NOTE — Progress Notes (Unsigned)
  Cardiology Office Note:  .   Date:  09/18/2023  ID:  Garrett Park, DOB 09/14/1966, MRN [id number] PCP: Garlan Fillers, MD  [facility] Providers Cardiologist:  Yvonne Kendall, MD { Click to update primary MD,subspecialty MD or APP then REFRESH:1}    History of Present Illness: .   Garrett Park is a 57 y.o. male with history of coronary artery disease status post single-vessel CABG (off-pump LIMA-LAD) in [place], [place] in 05/2022, and obstructive sleep apnea, who presents for follow-up of CAD and PAD.  I last saw him in May, August, at which time he was doing well from a heart standpoint.  He complained of intermittent hip/leg pain when walking.  Subsequent ABI's were abnormal on right (0.89) with Doppler suggesting severe ilaic artery disease.  No CP or SOB.  Still leg pain.  Buttocks and thighs and eventually in entire leg.  Worse on right than left.  Walks through pain.  Begins after ~100 yards.  Sometimes can walk farther with minimal pain.  About the same.  No rest pain or wound on legs.  Still smoking.  Didn't quit with Chantix.  Myalgias better.  Still some joint pain.  Catches cramps in ribs some  ROS: See HPI  Studies Reviewed: Marland Kitchen   EKG Interpretation Date/Time:  Wednesday September 18 2023 16:29:26 EST Ventricular Rate:  63 PR Interval:  168 QRS Duration:  82 QT Interval:  408 QTC Calculation: 417 R Axis:   66  Text Interpretation: Normal sinus rhythm Normal ECG When compared with ECG of 06-Mar-2023 No significant change was found Confirmed by Jencarlos Nicolson, Cristal Deer [phone]) on 09/18/2023 4:35:44 PM    *** Risk Assessment/Calculations:   {Does this patient have ATRIAL FIBRILLATION?:[id number]}         Physical Exam:   VS:  BP 131/78 (BP Location: Left Arm, Patient Position: Sitting, Cuff Size: Normal)   Pulse 63   Ht 5\' 10"  (1.778 m)   Wt 199 lb 9.6 oz (90.5 kg)   SpO2 97%   BMI 28.64 kg/m    Wt Readings from Last 3 Encounters:  09/18/23 199 lb 9.6 oz (90.5 kg)   06/19/23 200 lb 8 oz (90.9 kg)  03/06/23 200 lb (90.7 kg)    General:  NAD. Neck: No JVD or HJR. Lungs: Clear to auscultation bilaterally without wheezes or crackles. Heart: Regular rate and rhythm without murmurs, rubs, or gallops. Abdomen: Soft, nontender, nondistended. Extremities: No lower extremity edema.  ASSESSMENT AND PLAN: .    ***    {Are you ordering a CV Procedure (e.g. stress test, cath, DCCV, TEE, etc)?   Press F2        :[id number]}  Dispo: ***  Signed, Yvonne Kendall, MD

## 2023-09-19 ENCOUNTER — Encounter: Payer: Self-pay | Admitting: Internal Medicine

## 2023-09-19 DIAGNOSIS — G720 Drug-induced myopathy: Secondary | ICD-10-CM | POA: Insufficient documentation

## 2023-09-19 DIAGNOSIS — I739 Peripheral vascular disease, unspecified: Secondary | ICD-10-CM | POA: Insufficient documentation

## 2023-09-19 NOTE — Addendum Note (Signed)
Addended by: Parke Poisson on: 09/19/2023 08:05 AM   Modules accepted: Orders

## 2023-09-20 ENCOUNTER — Telehealth: Payer: Self-pay | Admitting: Internal Medicine

## 2023-09-20 NOTE — Telephone Encounter (Signed)
Left message on vm to schedule.

## 2023-09-20 NOTE — Telephone Encounter (Signed)
-----   Message from Nurse Kathrene Alu sent at 09/18/2023  5:21 PM EST ----- Please schedule Garrett Park a new PV consult appointment with Dr. Kirke Corin for PAD

## 2023-10-08 ENCOUNTER — Ambulatory Visit
Payer: No Typology Code available for payment source | Attending: Cardiovascular Disease | Admitting: Cardiovascular Disease

## 2023-10-08 ENCOUNTER — Encounter: Payer: Self-pay | Admitting: Cardiovascular Disease

## 2023-10-08 VITALS — BP 130/70 | HR 69 | Ht 70.5 in | Wt 201.1 lb

## 2023-10-08 DIAGNOSIS — E785 Hyperlipidemia, unspecified: Secondary | ICD-10-CM

## 2023-10-08 DIAGNOSIS — I739 Peripheral vascular disease, unspecified: Secondary | ICD-10-CM | POA: Diagnosis not present

## 2023-10-08 DIAGNOSIS — Z720 Tobacco use: Secondary | ICD-10-CM

## 2023-10-08 DIAGNOSIS — G720 Drug-induced myopathy: Secondary | ICD-10-CM

## 2023-10-08 DIAGNOSIS — I2510 Atherosclerotic heart disease of native coronary artery without angina pectoris: Secondary | ICD-10-CM

## 2023-10-08 DIAGNOSIS — T466X5D Adverse effect of antihyperlipidemic and antiarteriosclerotic drugs, subsequent encounter: Secondary | ICD-10-CM

## 2023-10-08 DIAGNOSIS — T466X5A Adverse effect of antihyperlipidemic and antiarteriosclerotic drugs, initial encounter: Secondary | ICD-10-CM

## 2023-10-08 NOTE — Patient Instructions (Addendum)
Medication Instructions:  No changes *If you need a refill on your cardiac medications before your next appointment, please call your pharmacy*   Lab Work: Your provider would like for you to return in a few days to have the following labs drawn: CBC and BMET.   Please go to [facility] [address] ([facility]) [address], [place] [address] You do not need an appointment.  They are open from 7:30 am-4 pm.  Lunch from 1:00 pm- 2:00 pm You will not need to be fasting.  If you have labs (blood work) drawn today and your tests are completely normal, you will receive your results only by: MyChart Message (if you have MyChart) OR A paper copy in the mail If you have any lab test that is abnormal or we need to change your treatment, we will call you to review the results.   Testing/Procedures: Your physician has requested that you have a peripheral vascular angiogram. This exam is performed at the hospital. During this exam IV contrast is used to look at arterial blood flow. Please review the information sheet given for details.    Follow-Up: At [facility], you and your health needs are our priority.  As part of our continuing mission to provide you with exceptional heart care, we have created designated Provider Care Teams.  These Care Teams include your primary Cardiologist (physician) and Advanced Practice Providers (APPs -  Physician Assistants and Nurse Practitioners) who all work together to provide you with the care you need, when you need it.  We recommend signing up for the patient portal called "MyChart".  Sign up information is provided on this After Visit Summary.  MyChart is used to connect with patients for Virtual Visits (Telemedicine).  Patients are able to view lab/test results, encounter notes, upcoming appointments, etc.  Non-urgent messages can be sent to your provider as well.   To learn more about what you can do with MyChart, go to  [email or web address].    Your next appointment:   1 month(s)  Provider:   You may see Dr. Kirke Corin or one of the following Advanced Practice Providers on your designated Care Team:   Nicolasa Ducking, NP Eula Listen, PA-C Cadence Fransico Michael, PA-C Charlsie Quest, NP Carlos Levering, NP    Other Instructions  [facility] A DEPT OF [place]. [facility] AT [facility] [address] [place] [address] Dept: [phone] Loc: [phone]  ZACKARIYA BIHL  10/08/2023  You are scheduled for a Peripheral Angiogram on Wednesday, December 18 with Dr. Lorine Bears.  1. Please arrive at the [facility] (Main Entrance A) at [facility]: [address] [place], [place] [address] at 11:00 PM (This time is 2 hour(s) before your procedure to ensure your preparation).   Free valet parking service is available. You will check in at ADMITTING. The support person will be asked to wait in the waiting room.  It is OK to have someone drop you off and come back when you are ready to be discharged.    Special note: Every effort is made to have your procedure done on time. Please understand that emergencies sometimes delay scheduled procedures.  2. Diet: Do not eat solid foods after midnight.  The patient may have clear liquids until 5am upon the day of the procedure.  3. Labs: You will need to have blood drawn by 10/11/23. You do not need to be fasting.  4.  Medication instructions in preparation for your procedure: Nothing to hold   On the morning of your procedure, take your Aspirin 81 mg and any morning medicines NOT listed above.  You may use sips of water.  5. Plan to go home the same day, you will only stay overnight if medically necessary. 6. Bring a current list of your medications and current insurance cards. 7. You MUST have a responsible person to drive you home. 8. Someone MUST be with you the first 24 hours after  you arrive home or your discharge will be delayed. 9. Please wear clothes that are easy to get on and off and wear slip-on shoes.  Thank you for allowing [place] to care for you!   -- [facility]

## 2023-10-08 NOTE — Progress Notes (Signed)
Cardiology Office Note   Date:  10/11/2023   ID:  Garrett Park, Garrett Park August 10, 1966, MRN [id number]  PCP:  Garlan Fillers, MD  Cardiologist:  Dr. Okey Dupre  Chief Complaint  Patient presents with   New Patient (Initial Visit)    PAD c/o leg pain. Meds reviewed verbally with pt.      History of Present Illness: Garrett Park is a 57 y.o. male who was referred by Dr. Okey Dupre for evaluation and management of peripheral arterial disease. He has known history of coronary artery disease status post one-vessel CABG with LIMA to LAD in August 2023, obstructive sleep apnea, tobacco use, hyperlipidemia and peripheral arterial disease.  He has been having bilateral leg pain that usually starts in the buttocks and radiates to the thighs and calfs after walking about 100 yards.  It is  worse on the right than the left.  He has no rest pain or lower extremity ulceration.  He feels significantly limited by his symptoms especially after he walks for a while.  He gets burning sensation throughout his legs.     Past Medical History:  Diagnosis Date   Anxiety yrs ago   Asthma    Chronic low back pain    Coronary artery disease    Dyslipidemia    GERD (gastroesophageal reflux disease)    Heat rash    both arms and chest   Neck pain    OSA (obstructive sleep apnea)    intolerant of cpap   OSA and COPD overlap syndrome (HCC) 01/09/2016   uses online detal device occ   Peyronie disease     Past Surgical History:  Procedure Laterality Date   COLONOSCOPY     CORONARY ARTERY BYPASS GRAFT  06/01/2022   LIMA-LAD   ESOPHAGOGASTRODUODENOSCOPY     HIP PINNING,CANNULATED Right 01/30/2017   Procedure: CANNULATED HIP PINNING;  Surgeon: Deeann Saint, MD;  Location: [facility] ORS;  Service: Orthopedics;  Laterality: Right;   NESBIT PROCEDURE N/A 04/08/2020   Procedure: NESBIT PROCEDURE, PENILE BLOCK;  Surgeon: Sebastian Ache, MD;  Location: [facility];  Service: Urology;  Laterality: N/A;   1 HR     Current Outpatient Medications  Medication Sig Dispense Refill   albuterol (VENTOLIN HFA) 108 (90 Base) MCG/ACT inhaler Inhale 1-2 puffs into the lungs every 6 (six) hours as needed for shortness of breath or wheezing.     aspirin EC 81 MG tablet Take 81 mg by mouth daily.     Evolocumab (REPATHA SURECLICK) 140 MG/ML SOAJ Inject 140 mg into the skin every 14 (fourteen) days. 6 mL 2   fluticasone (FLONASE ALLERGY RELIEF) 50 MCG/ACT nasal spray Place 1 spray into both nostrils daily as needed for allergies or rhinitis.     melatonin 5 MG TABS Take 5 mg by mouth at bedtime as needed (sleep).     metoprolol tartrate (LOPRESSOR) 25 MG tablet Take 12.5 mg by mouth 2 (two) times daily.     nitroGLYCERIN (NITROSTAT) 0.4 MG SL tablet Place 1 tablet (0.4 mg total) under the tongue every 5 (five) minutes as needed for chest pain. 25 tablet 3   pantoprazole (PROTONIX) 40 MG tablet Take 40 mg by mouth daily.     acetaminophen (TYLENOL) 500 MG tablet Take 500 mg by mouth every 6 (six) hours as needed for moderate pain (pain score 4-6).     No current facility-administered medications for this visit.    Allergies:   Pollen extract  Social History:  The patient  reports that he has been smoking cigarettes. He started smoking about 40 years ago. He has a 40.3 pack-year smoking history. He has never used smokeless tobacco. He reports that he does not currently use alcohol. He reports that he does not use drugs.   Family History:  The patient's family history includes COPD in his father; Dementia in his mother; Diabetes Mellitus II in his father; Heart disease (age of onset: 72) in his father; Hyperlipidemia in his father; Prostate cancer in his brother.    ROS:  Please see the history of present illness.   Otherwise, review of systems are positive for none.   All other systems are reviewed and negative.    PHYSICAL EXAM: VS:  BP 130/70 (BP Location: Left Arm, Patient Position: Sitting, Cuff  Size: Normal)   Pulse 69   Ht 5' 10.5" (1.791 m)   Wt 201 lb 2 oz (91.2 kg)   SpO2 99%   BMI 28.45 kg/m  , BMI Body mass index is 28.45 kg/m. GEN: Well nourished, well developed, in no acute distress  HEENT: normal  Neck: no JVD, carotid bruits, or masses Cardiac: RRR; no murmurs, rubs, or gallops,no edema  Respiratory:  clear to auscultation bilaterally, normal work of breathing GI: soft, nontender, nondistended, + BS MS: no deformity or atrophy  Skin: warm and dry, no rash Neuro:  Strength and sensation are intact Psych: euthymic mood, full affect Vascular: Femoral pulses +1 on the right and slightly diminished on the left.  Distal pulses are diminished +1 bilaterally.   EKG:  EKG is not ordered today.    Recent Labs: 09/03/2023: ALT 32 10/10/2023: BUN 11; Creatinine, Ser 1.11; Hemoglobin 15.1; Platelets 168; Potassium 4.5; Sodium 142    Lipid Panel    Component Value Date/Time   CHOL 112 09/03/2023 0911   TRIG 126 09/03/2023 0911   HDL 23 (L) 09/03/2023 0911   CHOLHDL 4.9 09/03/2023 0911   LDLCALC 66 09/03/2023 0911      Wt Readings from Last 3 Encounters:  10/08/23 201 lb 2 oz (91.2 kg)  09/18/23 199 lb 9.6 oz (90.5 kg)  06/19/23 200 lb 8 oz (90.9 kg)          10/08/2023    4:42 PM 03/06/2023    9:55 AM  PAD Screen  Previous PAD dx? No No  Previous surgical procedure? No No  Pain with walking? Yes Yes  Subsides with rest? No No  Feet/toe relief with dangling? No No  Painful, non-healing ulcers? No No  Extremities discolored? No No      ASSESSMENT AND PLAN:  1.  Peripheral arterial disease: Severe lifestyle limiting claudication mostly on the right side with some symptoms on the left.  He has evidence of significant external iliac artery disease.  I discussed with him the natural history and management of peripheral arterial disease and discussed the importance of controlling his risk factors. He already tried to walk to improve his symptoms and  there has been no improvement.  Given significant limitations related to his peripheral arterial disease, I recommend proceeding with abdominal aortogram with lower extremity angiography and possible endovascular intervention.  I discussed the procedure in details as well as risks and benefits.  2.  Coronary artery disease involving native coronary arteries without angina: No anginal symptoms.  3.  Hyperlipidemia on statin myopathy: He is doing very well with Repatha  4.  Tobacco use: I discussed the associations of tobacco use  and progression of peripheral arterial disease.  I strongly advised him to quit smoking.    Disposition:   Proceed with angiography and follow-up after.  Signed,  Lorine Bears, MD  10/11/2023 8:57 AM    [facility]

## 2023-10-08 NOTE — H&P (View-Only) (Signed)
Cardiology Office Note   Date:  10/11/2023   ID:  Garrett Park August 10, 1966, MRN [id number]  PCP:  Garlan Fillers, MD  Cardiologist:  Dr. Okey Dupre  Chief Complaint  Patient presents with   New Patient (Initial Visit)    PAD c/o leg pain. Meds reviewed verbally with pt.      History of Present Illness: Garrett Park is a 57 y.o. male who was referred by Dr. Okey Dupre for evaluation and management of peripheral arterial disease. He has known history of coronary artery disease status post one-vessel CABG with LIMA to LAD in August 2023, obstructive sleep apnea, tobacco use, hyperlipidemia and peripheral arterial disease.  He has been having bilateral leg pain that usually starts in the buttocks and radiates to the thighs and calfs after walking about 100 yards.  It is  worse on the right than the left.  He has no rest pain or lower extremity ulceration.  He feels significantly limited by his symptoms especially after he walks for a while.  He gets burning sensation throughout his legs.     Past Medical History:  Diagnosis Date   Anxiety yrs ago   Asthma    Chronic low back pain    Coronary artery disease    Dyslipidemia    GERD (gastroesophageal reflux disease)    Heat rash    both arms and chest   Neck pain    OSA (obstructive sleep apnea)    intolerant of cpap   OSA and COPD overlap syndrome (HCC) 01/09/2016   uses online detal device occ   Peyronie disease     Past Surgical History:  Procedure Laterality Date   COLONOSCOPY     CORONARY ARTERY BYPASS GRAFT  06/01/2022   LIMA-LAD   ESOPHAGOGASTRODUODENOSCOPY     HIP PINNING,CANNULATED Right 01/30/2017   Procedure: CANNULATED HIP PINNING;  Surgeon: Deeann Saint, MD;  Location: [facility] OR[facility];  Service: Orthopedics;  Laterality: Right;   NESBIT PROCEDURE N/A 04/08/2020   Procedure: NESBIT PROCEDURE, PENILE BLOCK;  Surgeon: Sebastian Ache, MD;  Location: [facility];  Service: Urology;  Laterality: N/A;   1 HR     Current Outpatient Medications  Medication Sig Dispense Refill   albuterol (VENTOLIN HFA) 108 (90 Base) MCG/ACT inhaler Inhale 1-2 puffs into the lungs every 6 (six) hours as needed for shortness of breath or wheezing.     aspirin EC 81 MG tablet Take 81 mg by mouth daily.     Evolocumab (REPATHA SURECLICK) 140 MG/ML SOAJ Inject 140 mg into the skin every 14 (fourteen) days. 6 mL 2   fluticasone (FLONASE ALLERGY RELIEF) 50 MCG/ACT nasal spray Place 1 spray into both nostrils daily as needed for allergies or rhinitis.     melatonin 5 MG TABS Take 5 mg by mouth at bedtime as needed (sleep).     metoprolol tartrate (LOPRESSOR) 25 MG tablet Take 12.5 mg by mouth 2 (two) times daily.     nitroGLYCERIN (NITROSTAT) 0.4 MG SL tablet Place 1 tablet (0.4 mg total) under the tongue every 5 (five) minutes as needed for chest pain. 25 tablet 3   pantoprazole (PROTONIX) 40 MG tablet Take 40 mg by mouth daily.     acetaminophen (TYLENOL) 500 MG tablet Take 500 mg by mouth every 6 (six) hours as needed for moderate pain (pain score 4-6).     No current facility-administered medications for this visit.    Allergies:   Pollen extract  Social History:  The patient  reports that he has been smoking cigarettes. He started smoking about 40 years ago. He has a 40.3 pack-year smoking history. He has never used smokeless tobacco. He reports that he does not currently use alcohol. He reports that he does not use drugs.   Family History:  The patient's family history includes COPD in his father; Dementia in his mother; Diabetes Mellitus II in his father; Heart disease (age of onset: 72) in his father; Hyperlipidemia in his father; Prostate cancer in his brother.    ROS:  Please see the history of present illness.   Otherwise, review of systems are positive for none.   All other systems are reviewed and negative.    PHYSICAL EXAM: VS:  BP 130/70 (BP Location: Left Arm, Patient Position: Sitting, Cuff  Size: Normal)   Pulse 69   Ht 5' 10.5" (1.791 m)   Wt 201 lb 2 oz (91.2 kg)   SpO2 99%   BMI 28.45 kg/m  , BMI Body mass index is 28.45 kg/m. GEN: Well nourished, well developed, in no acute distress  HEENT: normal  Neck: no JVD, carotid bruits, or masses Cardiac: RRR; no murmurs, rubs, or gallops,no edema  Respiratory:  clear to auscultation bilaterally, normal work of breathing GI: soft, nontender, nondistended, + BS MS: no deformity or atrophy  Skin: warm and dry, no rash Neuro:  Strength and sensation are intact Psych: euthymic mood, full affect Vascular: Femoral pulses +1 on the right and slightly diminished on the left.  Distal pulses are diminished +1 bilaterally.   EKG:  EKG is not ordered today.    Recent Labs: 09/03/2023: ALT 32 10/10/2023: BUN 11; Creatinine, Ser 1.11; Hemoglobin 15.1; Platelets 168; Potassium 4.5; Sodium 142    Lipid Panel    Component Value Date/Time   CHOL 112 09/03/2023 0911   TRIG 126 09/03/2023 0911   HDL 23 (L) 09/03/2023 0911   CHOLHDL 4.9 09/03/2023 0911   LDLCALC 66 09/03/2023 0911      Wt Readings from Last 3 Encounters:  10/08/23 201 lb 2 oz (91.2 kg)  09/18/23 199 lb 9.6 oz (90.5 kg)  06/19/23 200 lb 8 oz (90.9 kg)          10/08/2023    4:42 PM 03/06/2023    9:55 AM  PAD Screen  Previous PAD dx? No No  Previous surgical procedure? No No  Pain with walking? Yes Yes  Subsides with rest? No No  Feet/toe relief with dangling? No No  Painful, non-healing ulcers? No No  Extremities discolored? No No      ASSESSMENT AND PLAN:  1.  Peripheral arterial disease: Severe lifestyle limiting claudication mostly on the right side with some symptoms on the left.  He has evidence of significant external iliac artery disease.  I discussed with him the natural history and management of peripheral arterial disease and discussed the importance of controlling his risk factors. He already tried to walk to improve his symptoms and  there has been no improvement.  Given significant limitations related to his peripheral arterial disease, I recommend proceeding with abdominal aortogram with lower extremity angiography and possible endovascular intervention.  I discussed the procedure in details as well as risks and benefits.  2.  Coronary artery disease involving native coronary arteries without angina: No anginal symptoms.  3.  Hyperlipidemia on statin myopathy: He is doing very well with Repatha  4.  Tobacco use: I discussed the associations of tobacco use  and progression of peripheral arterial disease.  I strongly advised him to quit smoking.    Disposition:   Proceed with angiography and follow-up after.  Signed,  Lorine Bears, MD  10/11/2023 8:57 AM    [facility]

## 2023-10-11 LAB — BASIC METABOLIC PANEL
BUN/Creatinine Ratio: 10 (ref 9–20)
BUN: 11 mg/dL (ref 6–24)
CO2: 21 mmol/L (ref 20–29)
Calcium: 8.6 mg/dL — ABNORMAL LOW (ref 8.7–10.2)
Chloride: 104 mmol/L (ref 96–106)
Creatinine, Ser: 1.11 mg/dL (ref 0.76–1.27)
Glucose: 84 mg/dL (ref 70–99)
Potassium: 4.5 mmol/L (ref 3.5–5.2)
Sodium: 142 mmol/L (ref 134–144)
eGFR: 77 mL/min/{1.73_m2} (ref >59)

## 2023-10-11 LAB — CBC
Hematocrit: 45.3 % (ref 37.5–51.0)
Hemoglobin: 15.1 g/dL (ref 13.0–17.7)
MCH: 32 pg (ref 26.6–33.0)
MCHC: 33.3 g/dL (ref 31.5–35.7)
MCV: 96 fL (ref 79–97)
Platelets: 168 10*3/uL (ref 150–450)
RBC: 4.72 x10E6/uL (ref 4.14–5.80)
RDW: 12.5 % (ref 11.6–15.4)
WBC: 8 10*3/uL (ref 3.4–10.8)

## 2023-10-15 ENCOUNTER — Telehealth: Payer: Self-pay | Admitting: *Deleted

## 2023-10-15 NOTE — Telephone Encounter (Signed)
Abdominal aortogram scheduled at [facility] for: Wednesday October 16, 2023 1 PM Arrival time [facility] Main Entrance A at: 11 AM  Nothing to eat after midnight prior to procedure, clear liquids until 5 AM day of procedure.  Medication instructions: -Usual morning medications can be taken with sips of water including aspirin 81 mg.  Plan to go home the same day, you will only stay overnight if medically necessary.  You must have responsible adult to drive you home.  Someone must be with you the first 24 hours after you arrive home.  Left message for patient to call back to review procedure instructions

## 2023-10-15 NOTE — Telephone Encounter (Signed)
Reviewed procedure instructions with patient.  

## 2023-10-16 ENCOUNTER — Ambulatory Visit (HOSPITAL_COMMUNITY)
Admission: RE | Admit: 2023-10-16 | Discharge: 2023-10-16 | Disposition: A | Discharge status: Home / Self Care | Payer: No Typology Code available for payment source | Attending: Cardiovascular Disease | Admitting: Cardiovascular Disease

## 2023-10-16 ENCOUNTER — Ambulatory Visit (HOSPITAL_COMMUNITY)
Admission: RE | Disposition: A | Discharge status: Home / Self Care | Payer: Self-pay | Source: Home / Self Care | Attending: Cardiovascular Disease

## 2023-10-16 ENCOUNTER — Other Ambulatory Visit: Payer: Self-pay

## 2023-10-16 DIAGNOSIS — Z8249 Family history of ischemic heart disease and other diseases of the circulatory system: Secondary | ICD-10-CM | POA: Insufficient documentation

## 2023-10-16 DIAGNOSIS — F17200 Nicotine dependence, unspecified, uncomplicated: Secondary | ICD-10-CM | POA: Diagnosis not present

## 2023-10-16 DIAGNOSIS — I2510 Atherosclerotic heart disease of native coronary artery without angina pectoris: Secondary | ICD-10-CM | POA: Diagnosis not present

## 2023-10-16 DIAGNOSIS — I70211 Atherosclerosis of native arteries of extremities with intermittent claudication, right leg: Secondary | ICD-10-CM | POA: Diagnosis present

## 2023-10-16 DIAGNOSIS — E785 Hyperlipidemia, unspecified: Secondary | ICD-10-CM | POA: Diagnosis not present

## 2023-10-16 DIAGNOSIS — I739 Peripheral vascular disease, unspecified: Secondary | ICD-10-CM

## 2023-10-16 HISTORY — PX: PERIPHERAL VASCULAR INTERVENTION: CATH118257

## 2023-10-16 HISTORY — PX: ABDOMINAL AORTOGRAM W/LOWER EXTREMITY: CATH118223

## 2023-10-16 HISTORY — PX: PERIPHERAL VASCULAR BALLOON ANGIOPLASTY: CATH118281

## 2023-10-16 LAB — POCT ACTIVATED CLOTTING TIME: Activated Clotting Time: 308 s

## 2023-10-16 SURGERY — ABDOMINAL AORTOGRAM W/LOWER EXTREMITY
Anesthesia: LOCAL

## 2023-10-16 MED ORDER — ONDANSETRON HCL 4 MG/2ML IJ SOLN: 4.0000 mg | Freq: Four times a day (QID) | INTRAMUSCULAR | Status: DC | PRN

## 2023-10-16 MED ORDER — SODIUM CHLORIDE 0.9% FLUSH: 3.0000 mL | INTRAVENOUS | Status: DC | PRN

## 2023-10-16 MED ORDER — CLOPIDOGREL BISULFATE 300 MG PO TABS
ORAL_TABLET | ORAL | Status: DC | PRN
  Administered 2023-10-16: 600 mg via ORAL

## 2023-10-16 MED ORDER — ACETAMINOPHEN 325 MG PO TABS: 650.0000 mg | ORAL_TABLET | ORAL | Status: DC | PRN

## 2023-10-16 MED ORDER — ASPIRIN 81 MG PO CHEW
81.0000 mg | CHEWABLE_TABLET | ORAL | Status: DC
Start: 2023-10-16 — End: 2023-10-16

## 2023-10-16 MED ORDER — MIDAZOLAM HCL 2 MG/2ML IJ SOLN
INTRAMUSCULAR | Status: AC
Start: 2023-10-16 — End: ?
  Filled 2023-10-16: qty 2

## 2023-10-16 MED ORDER — CLOPIDOGREL BISULFATE 300 MG PO TABS
ORAL_TABLET | ORAL | Status: AC
  Filled 2023-10-16: qty 1

## 2023-10-16 MED ORDER — SODIUM CHLORIDE 0.9 % IV SOLN
INTRAVENOUS | Status: DC
Start: 2023-10-16 — End: 2023-10-16

## 2023-10-16 MED ORDER — SODIUM CHLORIDE 0.9 % IV SOLN: INTRAVENOUS | Status: DC

## 2023-10-16 MED ORDER — CLOPIDOGREL BISULFATE 75 MG PO TABS: 75.0000 mg | ORAL_TABLET | Freq: Every day | ORAL | 6 refills | Status: DC

## 2023-10-16 MED ORDER — SODIUM CHLORIDE 0.9 % IV SOLN: 250.0000 mL | INTRAVENOUS | Status: DC | PRN

## 2023-10-16 MED ORDER — SODIUM CHLORIDE 0.9% FLUSH: 3.0000 mL | Freq: Two times a day (BID) | INTRAVENOUS | Status: DC

## 2023-10-16 MED ORDER — MIDAZOLAM HCL 2 MG/2ML IJ SOLN
INTRAMUSCULAR | Status: DC | PRN
  Administered 2023-10-16: 1 mg via INTRAVENOUS

## 2023-10-16 MED ORDER — HEPARIN SODIUM (PORCINE) 1000 UNIT/ML IJ SOLN
INTRAMUSCULAR | Status: AC
  Filled 2023-10-16: qty 10

## 2023-10-16 MED ORDER — IODIXANOL 320 MG/ML IV SOLN
INTRAVENOUS | Status: DC | PRN
  Administered 2023-10-16: 80 mL

## 2023-10-16 MED ORDER — LIDOCAINE HCL (PF) 1 % IJ SOLN
INTRAMUSCULAR | Status: AC
  Filled 2023-10-16: qty 30

## 2023-10-16 MED ORDER — FENTANYL CITRATE (PF) 100 MCG/2ML IJ SOLN
INTRAMUSCULAR | Status: DC | PRN
  Administered 2023-10-16: 25 ug via INTRAVENOUS

## 2023-10-16 MED ORDER — HEPARIN SODIUM (PORCINE) 1000 UNIT/ML IJ SOLN
INTRAMUSCULAR | Status: DC | PRN
  Administered 2023-10-16: 6000 [IU] via INTRAVENOUS

## 2023-10-16 MED ORDER — FENTANYL CITRATE (PF) 100 MCG/2ML IJ SOLN
INTRAMUSCULAR | Status: AC
  Filled 2023-10-16: qty 2

## 2023-10-16 SURGICAL SUPPLY — 20 items
BAG SNAP BAND KOVER 36X36 (MISCELLANEOUS) IMPLANT
BALLN MUSTANG 6.0X40 75 (BALLOONS) ×2
BALLOON MUSTANG 6.0X40 75 (BALLOONS) IMPLANT
CATH ANGIO 5F PIGTAIL 65CM (CATHETERS) IMPLANT
CATH CROSS OVER TEMPO 5F (CATHETERS) IMPLANT
CATH STRAIGHT 5FR 65CM (CATHETERS) IMPLANT
COVER DOME SNAP 22 D (MISCELLANEOUS) IMPLANT
DEVICE CLOSURE MYNXGRIP 6/7F (Vascular Products) IMPLANT
GLIDEWIRE ADV .035X260CM (WIRE) IMPLANT
KIT ENCORE 26 ADVANTAGE (KITS) IMPLANT
KIT MICROPUNCTURE NIT STIFF (SHEATH) IMPLANT
KIT SYRINGE INJ CVI SPIKEX1 (MISCELLANEOUS) IMPLANT
SET ATX-X65L (MISCELLANEOUS) IMPLANT
SHEATH CATAPULT 6FR 45 (SHEATH) IMPLANT
SHEATH PINNACLE 5F 10CM (SHEATH) IMPLANT
SHEATH PINNACLE 6F 10CM (SHEATH) IMPLANT
SHEATH PROBE COVER 6X72 (BAG) IMPLANT
STENT ABSOLUTE PRO 7X40X135 (Permanent Stent) IMPLANT
TRAY PV CATH (CUSTOM PROCEDURE TRAY) ×2 IMPLANT
WIRE HITORQ VERSACORE ST 145CM (WIRE) IMPLANT

## 2023-10-16 NOTE — Interval H&P Note (Signed)
History and Physical Interval Note:  10/16/2023 2:19 PM  Garrett Park  has presented today for surgery, with the diagnosis of pad.  The various methods of treatment have been discussed with the patient and family. After consideration of risks, benefits and other options for treatment, the patient has consented to  Procedure(s): ABDOMINAL AORTOGRAM W/LOWER EXTREMITY (N/A) as a surgical intervention.  The patient's history has been reviewed, patient examined, no change in status, stable for surgery.  I have reviewed the patient's chart and labs.  Questions were answered to the patient's satisfaction.     Lorine Bears

## 2023-10-17 ENCOUNTER — Telehealth: Payer: Self-pay | Admitting: Cardiovascular Disease

## 2023-10-17 ENCOUNTER — Encounter (HOSPITAL_COMMUNITY): Payer: Self-pay | Admitting: Cardiovascular Disease

## 2023-10-17 ENCOUNTER — Other Ambulatory Visit: Payer: Self-pay | Admitting: *Deleted

## 2023-10-17 DIAGNOSIS — I739 Peripheral vascular disease, unspecified: Secondary | ICD-10-CM

## 2023-10-17 MED FILL — Lidocaine HCl Local Preservative Free (PF) Inj 1%: INTRAMUSCULAR | Qty: 30 | Status: AC

## 2023-10-17 NOTE — Telephone Encounter (Signed)
-----   Message from Nurse Misty Stanley R sent at 10/17/2023  1:45 PM EST ----- Patient needs post procedure dopplers in 2-3 weeks ----- Message ----- From: Iran Ouch, MD Sent: 10/16/2023   3:43 PM EST To: Sandi Mariscal, RN  Status post right external iliac artery stent placement.  Schedule an ABI and aortoiliac duplex within 2 to 3 weeks.

## 2023-10-17 NOTE — Telephone Encounter (Signed)
Left voicemail to schedule appt, please schedule

## 2023-10-23 ENCOUNTER — Emergency Department (HOSPITAL_COMMUNITY): Payer: No Typology Code available for payment source

## 2023-10-23 ENCOUNTER — Emergency Department (HOSPITAL_BASED_OUTPATIENT_CLINIC_OR_DEPARTMENT_OTHER): Payer: No Typology Code available for payment source

## 2023-10-23 ENCOUNTER — Encounter (HOSPITAL_COMMUNITY): Payer: Self-pay | Admitting: Emergency Medicine

## 2023-10-23 ENCOUNTER — Emergency Department (HOSPITAL_COMMUNITY)
Admission: EM | Admit: 2023-10-23 | Discharge: 2023-10-23 | Disposition: A | Discharge status: Home / Self Care | Payer: No Typology Code available for payment source | Attending: Emergency Medicine | Admitting: Emergency Medicine

## 2023-10-23 ENCOUNTER — Other Ambulatory Visit: Payer: Self-pay

## 2023-10-23 DIAGNOSIS — Z7902 Long term (current) use of antithrombotics/antiplatelets: Secondary | ICD-10-CM | POA: Insufficient documentation

## 2023-10-23 DIAGNOSIS — M7981 Nontraumatic hematoma of soft tissue: Secondary | ICD-10-CM | POA: Diagnosis not present

## 2023-10-23 DIAGNOSIS — Z7982 Long term (current) use of aspirin: Secondary | ICD-10-CM | POA: Diagnosis not present

## 2023-10-23 DIAGNOSIS — Z4801 Encounter for change or removal of surgical wound dressing: Secondary | ICD-10-CM | POA: Insufficient documentation

## 2023-10-23 DIAGNOSIS — I2510 Atherosclerotic heart disease of native coronary artery without angina pectoris: Secondary | ICD-10-CM | POA: Diagnosis not present

## 2023-10-23 DIAGNOSIS — T148XXA Other injury of unspecified body region, initial encounter: Secondary | ICD-10-CM

## 2023-10-23 DIAGNOSIS — Z951 Presence of aortocoronary bypass graft: Secondary | ICD-10-CM | POA: Diagnosis not present

## 2023-10-23 DIAGNOSIS — G8918 Other acute postprocedural pain: Secondary | ICD-10-CM

## 2023-10-23 DIAGNOSIS — Z5189 Encounter for other specified aftercare: Secondary | ICD-10-CM

## 2023-10-23 LAB — COMPREHENSIVE METABOLIC PANEL
ALT: 29 U/L (ref 0–44)
AST: 29 U/L (ref 15–41)
Albumin: 3.4 g/dL — ABNORMAL LOW (ref 3.5–5.0)
Alkaline Phosphatase: 56 U/L (ref 38–126)
Anion gap: 6 (ref 5–15)
BUN: 10 mg/dL (ref 6–20)
CO2: 21 mmol/L — ABNORMAL LOW (ref 22–32)
Calcium: 8.4 mg/dL — ABNORMAL LOW (ref 8.9–10.3)
Chloride: 105 mmol/L (ref 98–111)
Creatinine, Ser: 0.99 mg/dL (ref 0.61–1.24)
GFR, Estimated: >60 mL/min (ref >60)
Glucose, Bld: 100 mg/dL — ABNORMAL HIGH (ref 70–99)
Potassium: 4.1 mmol/L (ref 3.5–5.1)
Sodium: 132 mmol/L — ABNORMAL LOW (ref 135–145)
Total Bilirubin: 0.7 mg/dL (ref <1.2)
Total Protein: 7.2 g/dL (ref 6.5–8.1)

## 2023-10-23 LAB — CBC WITH DIFFERENTIAL/PLATELET
Abs Immature Granulocytes: 0.02 10*3/uL (ref 0.00–0.07)
Basophils Absolute: 0.1 10*3/uL (ref 0.0–0.1)
Basophils Relative: 1 %
Eosinophils Absolute: 0.4 10*3/uL (ref 0.0–0.5)
Eosinophils Relative: 6 %
HCT: 41.2 % (ref 39.0–52.0)
Hemoglobin: 13.9 g/dL (ref 13.0–17.0)
Immature Granulocytes: 0 %
Lymphocytes Relative: 30 %
Lymphs Abs: 2.1 10*3/uL (ref 0.7–4.0)
MCH: 32.7 pg (ref 26.0–34.0)
MCHC: 33.7 g/dL (ref 30.0–36.0)
MCV: 96.9 fL (ref 80.0–100.0)
Monocytes Absolute: 0.7 10*3/uL (ref 0.1–1.0)
Monocytes Relative: 9 %
Neutro Abs: 3.9 10*3/uL (ref 1.7–7.7)
Neutrophils Relative %: 54 %
Platelets: 167 10*3/uL (ref 150–400)
RBC: 4.25 MIL/uL (ref 4.22–5.81)
RDW: 12.7 % (ref 11.5–15.5)
WBC: 7.2 10*3/uL (ref 4.0–10.5)
nRBC: 0 % (ref 0.0–0.2)

## 2023-10-23 LAB — I-STAT CG4 LACTIC ACID, ED: Lactic Acid, Venous: 0.7 mmol/L (ref 0.5–1.9)

## 2023-10-23 NOTE — Discharge Instructions (Signed)
You have been evaluated for your symptoms.  Your discomfort is likely due to a hematoma.  You may apply pressure dressing to the affected area and follow-up closely with your vascular surgeon for further care.

## 2023-10-23 NOTE — Progress Notes (Addendum)
Lower extremity arterial duplex completed. Please see CV Procedures for preliminary results.  Shona Simpson, RVT 10/23/23 1:21 PM

## 2023-10-23 NOTE — ED Provider Notes (Signed)
[place] EMERGENCY DEPARTMENT AT [facility] Provider Note   CSN: [id number] Arrival date & time: 10/23/23  1136     History  Chief Complaint  Patient presents with   Wound Check    Garrett Park is a 57 y.o. male.  The history is provided by the patient and medical records. No language interpreter was used.     57 year old male with significant history of CAD status post one-vessel CABG, history of peripheral artery disease, recently evaluated for bilateral leg pain related to claudication presenting today with concerns of wound infection.  Patient recently had a right external iliac artery stent placement done on 12/18.  After the procedure he noticed a lump at the insertion site in his left groin.  He noticed that it has increased in size slightly and now more to the size of his thumb.  Today while walking he felt some increasing pain and when he looked down he saw blood draining out from it which concerns him.  After but draining he felt his pain seems to be improved a bit.  He does not endorse any associated fever or chills he has been eating and drinking fine he has not noticed any pus.  Denies any worsening leg pain.  Home Medications Prior to Admission medications   Medication Sig Start Date End Date Taking? Authorizing Provider  acetaminophen (TYLENOL) 500 MG tablet Take 500 mg by mouth every 6 (six) hours as needed for moderate pain (pain score 4-6).    [provider]  albuterol (VENTOLIN HFA) 108 (90 Base) MCG/ACT inhaler Inhale 1-2 puffs into the lungs every 6 (six) hours as needed for shortness of breath or wheezing. 06/12/22   [provider]  aspirin EC 81 MG tablet Take 81 mg by mouth daily. 01/22/22   [provider]  clopidogrel (PLAVIX) 75 MG tablet Take 1 tablet (75 mg total) by mouth daily. 10/16/23 10/15/24  Iran Ouch, MD  Evolocumab (REPATHA SURECLICK) 140 MG/ML SOAJ Inject 140 mg into the skin every 14 (fourteen)  days. 09/18/23   End, Cristal Deer, MD  fluticasone [facility] ALLERGY RELIEF) 50 MCG/ACT nasal spray Place 1 spray into both nostrils daily as needed for allergies or rhinitis.    [provider]  melatonin 5 MG TABS Take 5 mg by mouth at bedtime as needed (sleep). 06/05/22   [provider]  metoprolol tartrate (LOPRESSOR) 25 MG tablet Take 12.5 mg by mouth 2 (two) times daily.    [provider]  nitroGLYCERIN (NITROSTAT) 0.4 MG SL tablet Place 1 tablet (0.4 mg total) under the tongue every 5 (five) minutes as needed for chest pain. 03/06/23 10/08/23  End, Cristal Deer, MD  pantoprazole (PROTONIX) 40 MG tablet Take 40 mg by mouth daily. 06/05/22   [provider]      Allergies    Pollen extract    Review of Systems   Review of Systems  All other systems reviewed and are negative.   Physical Exam Updated Vital Signs BP (!) 165/72   Pulse 60   Temp 98.1 F (36.7 C) (Oral)   Resp 16   Ht 5\' 10"  (1.778 m)   Wt 88.5 kg   SpO2 100%   BMI 27.99 kg/m  Physical Exam Vitals and nursing note reviewed.  Constitutional:      General: He is not in acute distress.    Appearance: He is well-developed.  HENT:     Head: Atraumatic.  Eyes:  Conjunctiva/sclera: Conjunctivae normal.  Cardiovascular:     Rate and Rhythm: Normal rate and regular rhythm.     Pulses: Normal pulses.     Heart sounds: Normal heart sounds.  Pulmonary:     Effort: Pulmonary effort is normal.     Breath sounds: Normal breath sounds. No wheezing, rhonchi or rales.  Abdominal:     Palpations: Abdomen is soft.     Tenderness: There is no abdominal tenderness.  Genitourinary:    Comments: Insertion site at the left inguinal region with some mild subcutaneous firmness and it oozed out a small amount of serous fluid.  It is mildly tender but no significant warmth and no purulent discharge.  Palpable left femoral pulse. Musculoskeletal:     Cervical back: Neck supple.  Skin:     Capillary Refill: Capillary refill takes less than 2 seconds.     Findings: No rash.     Comments: Intact pedal pulse bilaterally  Neurological:     Mental Status: He is alert.     ED Results / Procedures / Treatments   Labs (all labs ordered are listed, but only abnormal results are displayed) Labs Reviewed  COMPREHENSIVE METABOLIC PANEL - Abnormal; Notable for the following components:      Result Value   Sodium 132 (*)    CO2 21 (*)    Glucose, Bld 100 (*)    Calcium 8.4 (*)    Albumin 3.4 (*)    All other components within normal limits  CBC WITH DIFFERENTIAL/PLATELET  I-STAT CG4 LACTIC ACID, ED  I-STAT CG4 LACTIC ACID, ED    EKG None  Radiology VAS [place] LOWER EXTREMITY ARTERIAL DUPLEX (7a-7p) Result Date: 10/23/2023 LOWER EXTREMITY ARTERIAL DUPLEX STUDY Patient Name:  BROGEN Park  Date of Exam:   10/23/2023 Medical Rec #: [id number]      Accession #:    [id number] Date of Birth: 1965/12/04      Patient Gender: M Patient Age:   1 years Exam Location:  [facility] Procedure:      VAS [place] LOWER EXTREMITY ARTERIAL DUPLEX Referring Phys: Fayrene Helper --------------------------------------------------------------------------------  Indications: Rest pain. High Risk Factors: Coronary artery disease.  Vascular Interventions: Aortogram 12/48/24. Current ABI:            N/A Comparison Study: None Performing Technologist: Shona Simpson  Examination Guidelines: A complete evaluation includes B-mode imaging, spectral Doppler, color Doppler, and power Doppler as needed of all accessible portions of each vessel. Bilateral testing is considered an integral part of a complete examination. Limited examinations for reoccurring indications may be performed as noted.   +-----------+--------+-----+--------+---------+--------+ LEFT       PSV cm/sRatioStenosisWaveform Comments +-----------+--------+-----+--------+---------+--------+ EIA Distal 87                   triphasic          +-----------+--------+-----+--------+---------+--------+ CFA Distal 73                   triphasic         +-----------+--------+-----+--------+---------+--------+ DFA        36                   triphasic         +-----------+--------+-----+--------+---------+--------+ SFA Prox   68                   triphasic         +-----------+--------+-----+--------+---------+--------+ SFA Mid    77  triphasic         +-----------+--------+-----+--------+---------+--------+ SFA Distal 51                   biphasic          +-----------+--------+-----+--------+---------+--------+ POP Prox   55                   biphasic          +-----------+--------+-----+--------+---------+--------+ POP Distal 49                   biphasic          +-----------+--------+-----+--------+---------+--------+ ATA Distal 21                   biphasic          +-----------+--------+-----+--------+---------+--------+ PTA Distal 38                   biphasic          +-----------+--------+-----+--------+---------+--------+ PERO Distal23                   biphasic          +-----------+--------+-----+--------+---------+--------+   Summary: Left: No PAD seen in the left leg.  See table(s) above for measurements and observations.    Preliminary    DG Chest 2 View Result Date: 10/23/2023 CLINICAL DATA:  Concern for postoperative infection. EXAM: CHEST - 2 VIEW COMPARISON:  Chest radiograph dated June 26, 2022. FINDINGS: The heart size and mediastinal contours are within normal limits. Prior median sternotomy. No focal consolidation, pleural effusion, or pneumothorax. No acute osseous abnormality. IMPRESSION: No acute cardiopulmonary findings. Electronically Signed   By: Hart Robinsons M.D.   On: 10/23/2023 12:19    Procedures Procedures    Medications Ordered in ED Medications - No data to display  ED Course/ Medical Decision Making/ A&P                                  Medical Decision Making Amount and/or Complexity of Data Reviewed Labs: ordered. Radiology: ordered.   BP (!) 165/72   Pulse 60   Temp 98.1 F (36.7 C) (Oral)   Resp 16   Ht 5\' 10"  (1.778 m)   Wt 88.5 kg   SpO2 100%   BMI 27.99 kg/m   4:81 PM 57 year old male with significant history of CAD status post one-vessel CABG, history of peripheral artery disease, recently evaluated for bilateral leg pain related to claudication presenting today with concerns of wound infection.  Patient recently had a right external iliac artery stent placement done on 12/18.  After the procedure he noticed a lump at the insertion site in his left groin.  He noticed that it has increased in size slightly and now more to the size of his thumb.  Today while walking he felt some increasing pain and when he looked down he saw blood draining out from it which concerns him.  After but draining he felt his pain seems to be improved a bit.  He does not endorse any associated fever or chills he has been eating and drinking fine he has not noticed any pus.  Denies any worsening leg pain.  Exam notable for some mild tenderness to left inguinal region at the site of previous stent placement.  There is some surrounding skin changes consistent with a hematoma and some firmness noted.  I  am able to express a small amount of serous fluid.  I did not appreciate any obvious signs of infection.    I suspect this is likely to be a subsequent hematoma from recent stent placement.  I have low suspicion for infection.  -Labs ordered, independently viewed and interpreted by me.  Labs remarkable for normal lactic acid, reassuring labs -The patient was maintained on a cardiac monitor.  I personally viewed and interpreted the cardiac monitored which showed an underlying rhythm of: NSR -Imaging independently viewed and interpreted by me and I agree with radiologist's interpretation.  Result remarkable for CXR without  acute changes. Arterial vascular US without acute finding -This patient presents to the ED for concern of post op pain, this involves an extensive number of treatment options, and is a complaint that carries with it a high risk of complications and morbidity.  The differential diagnosis includes hematoma, pseudoaneurysm, cellulitis, abscess -Co morbidities that complicate the patient evaluation includes CAD, peripheral artery disease -Treatment includes monitoring -Reevaluation of the patient after these medicines showed that the patient improved -PCP office notes or outside notes reviewed -Discussion with attending Dr. Hyacinth Meeker -Escalation to admission/observation considered: patients feels much better, is comfortable with discharge, and will follow up with PCP -Prescription medication considered, patient comfortable with pressure dressing to site -Social Determinant of Health considered          Final Clinical Impression(s) / ED Diagnoses Final diagnoses:  Visit for wound check  Hematoma    Rx / DC Orders ED Discharge Orders     None         Fayrene Helper, PA-C 10/23/23 1523    Eber Hong, MD 10/24/23 1701

## 2023-10-23 NOTE — ED Triage Notes (Addendum)
Pt BIB [facility] from home for blood oozing from site from stent placement to left femoral artery approx. 7 days ago.  Began as a lump at incision site 3 days ago.  Lump disappeared but blood oozing began today.  EMS did not note any bleeding or pus oozing at site during their care. I noted a small amt of blood at site.  No signs of infection. Pt does not endorse any pain at the site at this time.   Pt is a&O and afebile. VSS  131/70 98% RA, HR 62, RR 18, CBG 148

## 2023-10-28 ENCOUNTER — Telehealth: Payer: Self-pay | Admitting: Cardiovascular Disease

## 2023-10-28 NOTE — Telephone Encounter (Signed)
-----   Message from Nurse Misty Stanley R sent at 10/25/2023 11:45 AM EST ----- Patient needs his Aorta and ABI scheduled, please ----- Message ----- From: Iran Ouch, MD Sent: 10/16/2023   3:43 PM EST To: Sandi Mariscal, RN  Status post right external iliac artery stent placement.  Schedule an ABI and aortoiliac duplex within 2 to 3 weeks.

## 2023-10-28 NOTE — Progress Notes (Signed)
 Cardiology Clinic Note   Date: 10/31/2023 ID: Lumir, Demetriou 17-Nov-1965, MRN [id number]  Primary Cardiologist:  Lonni Hanson, MD  Patient Profile    Garrett Park is a 57 y.o. male who presents to the clinic today for follow up after iliac stent placement.     Past medical history significant for: CAD.   CABG x 1 06/01/2022 performed in [place], [place].  PAD. ABI/arterial ultrasound bilateral lower extremities 07/23/2023: Mild right lower extremity arterial disease.  Normal ABI left lower extremity.  Abnormal TBI bilaterally.  Right iliac segment 75 to 99%.  Left iliac segment 30 to 49%. Abdominal aortogram with lower extremities 10/16/2023: Right lower extremity with significant stenosis in the external iliac artery with no significant infrainguinal disease.  Left lower extremity with moderate external iliac artery stenosis with no significant infrainguinal disease.  Successful self-expanding stent placement to the right external iliac artery. Hyperlipidemia. Lipid panel 09/03/2023: LDL 66, HDL 23, TG 126, total 112. OSA. COPD. Tobacco abuse.  In summary, patient has a history of CAD with CABG x 1 in August 2023 performed in [place], [place].  He reported developing chest pain while vacationing in [place], [place] after eating pizza.  He attributed discomfort to heartburn until symptoms did not resolve with antacids.  He took NTG from his brother-in-law which resolved pain.  He presented to the local ED and was transferred to [facility] and underwent CABG x 1.  Patient was first evaluated by Dr. Hanson on 03/06/2023 to establish cardiac care.  He reported mental fogginess since CABG.  Also complained of muscle aches.  He came off of atorvastatin x 1 month with improvement of myalgias.  He was restarted on rosuvastatin .  Upon follow-up in August 2024 he complained of pain in hips and buttocks radiating down both legs with walking or exercise.  He underwent ABI and arterial ultrasound of bilateral  lower extremities and was found to have iliac stenosis as detailed above.  He was referred to Dr. Darron.  He underwent abdominal aortogram with stent placement to right external iliac artery.     History of Present Illness    Garrett Park is followed by Dr. Hanson and Dr. Darron for the above outlined history.  Patient was last seen in the office by Dr. Hanson on 09/18/2023 for routine follow-up.  He reported recurrent myalgias on rosuvastatin .  He was switched to Repatha .  Patient was evaluated by Dr. Darron on 10/08/2023 for bilateral leg pain.  He underwent successful self-expanding stent placement to right external iliac artery.  Patient presented to the ED on 10/23/2023 for complaints of oozing from left groin angiogram site. Patient reported noticing a lump at groin site after the procedure that resolved and then blood began oozing began on day of arrival. Left inguinal region was mildly tender to palpation with some firmness noted. No signs of infection. Small amount of serous fluid was expressed from the site. Hematoma was suspected. Pressure dressing was applied and patient was discharged home.   Today, patient is accompanied by his wife. He is doing well. He has not had any further oozing from groin site. He denies bruising, pain or tenderness in his groin area. He has been slowly resuming activities. Patient denies shortness of breath, dyspnea on exertion, lower extremity edema, orthopnea or PND. No chest pain, pressure, or tightness. No palpitations.       ROS: All other systems reviewed and are otherwise negative except as noted in History of Present Illness.  EKGs/Labs Reviewed        10/23/2023: ALT 29; AST 29; BUN 10; Creatinine, Ser 0.99; Potassium 4.1; Sodium 132   10/23/2023: Hemoglobin 13.9; WBC 7.2           Physical Exam    VS:  BP 120/62 (BP Location: Left Arm, Patient Position: Sitting, Cuff Size: Normal)   Pulse 63   Ht 5' 10.5 (1.791 m)   Wt 197 lb 8 oz (89.6  kg)   SpO2 97%   BMI 27.94 kg/m  , BMI Body mass index is 27.94 kg/m.  GEN: Well nourished, well developed, in no acute distress. Neck: No JVD or carotid bruits. Cardiac:  RRR. No murmurs. No rubs or gallops.   Respiratory:  Respirations regular and unlabored. Clear to auscultation without rales, wheezing or rhonchi. GI: Soft, nontender, nondistended. Extremities: Radials/DP/PT 1+ and equal bilaterally. No clubbing or cyanosis. No edema.  Skin: Warm and dry, no rash. Neuro: Strength intact.  Assessment & Plan   CAD S/p CABG x 29 May 2022. Patient denies chest pain, pressure or tightness.   -Continue aspirin , Plavix , Repatha , metoprolol , as needed SL NTG.  PAD S/p self-expanding stent placement to right external iliac artery 10/16/2023.  ED visit 10/23/2023 for hematoma.  Patient denies further drainage from groin site. Area is well healed with no tenderness to palpation, drainage, ecchymosis, erythema or edema. Positive femoral pulse.  -Repeat US  scheduled for 1/22. -Continue aspirin , Plavix , Repatha .  Hyperlipidemia/statin intolerance LDL November 2024 66, at goal.  Patient has not intolerant to statins secondary to myalgias. -Continue Repatha .  Tobacco abuse Patient continues to smoke close to a pack a day.  -Discussed systematically decrease cigarettes until complete cessation. He agrees to try.   Disposition: Return in 6 months or sooner as needed.          Signed, Barnie HERO. Mahogony Gilchrest, DNP, NP-C

## 2023-10-28 NOTE — Telephone Encounter (Signed)
LVM to schedule appt, please schedule 

## 2023-10-31 ENCOUNTER — Ambulatory Visit: Payer: No Typology Code available for payment source | Attending: Student | Admitting: Student

## 2023-10-31 ENCOUNTER — Encounter: Payer: Self-pay | Admitting: Student

## 2023-10-31 VITALS — BP 120/62 | HR 63 | Ht 70.5 in | Wt 197.5 lb

## 2023-10-31 DIAGNOSIS — I739 Peripheral vascular disease, unspecified: Secondary | ICD-10-CM

## 2023-10-31 DIAGNOSIS — G720 Drug-induced myopathy: Secondary | ICD-10-CM | POA: Diagnosis not present

## 2023-10-31 DIAGNOSIS — T466X5D Adverse effect of antihyperlipidemic and antiarteriosclerotic drugs, subsequent encounter: Secondary | ICD-10-CM

## 2023-10-31 DIAGNOSIS — I25810 Atherosclerosis of coronary artery bypass graft(s) without angina pectoris: Secondary | ICD-10-CM | POA: Diagnosis not present

## 2023-10-31 DIAGNOSIS — E785 Hyperlipidemia, unspecified: Secondary | ICD-10-CM

## 2023-10-31 DIAGNOSIS — Z720 Tobacco use: Secondary | ICD-10-CM

## 2023-10-31 NOTE — Patient Instructions (Addendum)
 Medication Instructions:  Your Physician recommend you continue on your current medication as directed.    *If you need a refill on your cardiac medications before your next appointment, please call your pharmacy*   Lab Work: None ordered at this time    Follow-Up: At [facility], you and your health needs are our priority.  As part of our continuing mission to provide you with exceptional heart care, we have created designated Provider Care Teams.  These Care Teams include your primary Cardiologist (physician) and Advanced Practice Providers (APPs -  Physician Assistants and Nurse Practitioners) who all work together to provide you with the care you need, when you need it.  We recommend signing up for the patient portal called MyChart.  Sign up information is provided on this After Visit Summary.  MyChart is used to connect with patients for Virtual Visits (Telemedicine).  Patients are able to view lab/test results, encounter notes, upcoming appointments, etc.  Non-urgent messages can be sent to your provider as well.   To learn more about what you can do with MyChart, go to [email or web address].    Your next appointment:   Schedule for May with Cardio team  Provider:   You may see Lonni Hanson, MD or one of the following Advanced Practice Providers on your designated Care Team:   Lonni Meager, NP Bernardino Bring, PA-C Cadence Franchester, PA-C Tylene Lunch, NP Barnie Hila, NP    Follow-up with Dr Darron if vascular testing deems appropriate

## 2023-11-20 ENCOUNTER — Ambulatory Visit: Payer: No Typology Code available for payment source

## 2023-11-20 ENCOUNTER — Ambulatory Visit: Payer: No Typology Code available for payment source | Attending: Cardiovascular Disease

## 2023-11-20 DIAGNOSIS — I739 Peripheral vascular disease, unspecified: Secondary | ICD-10-CM | POA: Diagnosis not present

## 2023-11-20 LAB — VAS US ABI WITH/WO TBI
Left ABI: 0.97
Right ABI: 0.89

## 2023-12-10 ENCOUNTER — Ambulatory Visit: Payer: No Typology Code available for payment source | Attending: Cardiovascular Disease

## 2023-12-10 DIAGNOSIS — Z95828 Presence of other vascular implants and grafts: Secondary | ICD-10-CM | POA: Diagnosis not present

## 2023-12-10 DIAGNOSIS — I739 Peripheral vascular disease, unspecified: Secondary | ICD-10-CM

## 2023-12-11 ENCOUNTER — Other Ambulatory Visit: Payer: Self-pay

## 2023-12-11 DIAGNOSIS — I739 Peripheral vascular disease, unspecified: Secondary | ICD-10-CM

## 2024-01-30 ENCOUNTER — Telehealth: Payer: Self-pay | Admitting: Internal Medicine

## 2024-01-30 MED ORDER — METOPROLOL TARTRATE 25 MG PO TABS: 12.5000 mg | ORAL_TABLET | Freq: Two times a day (BID) | ORAL | 2 refills | Status: DC

## 2024-01-30 NOTE — Telephone Encounter (Signed)
*  STAT* If patient is at the pharmacy, call can be transferred to refill team.   1. Which medications need to be refilled? (please list name of each medication and dose if known)  metoprolol tartrate (LOPRESSOR) 25 MG tablet  2. Which pharmacy/location (including street and city if local pharmacy) is medication to be sent to? [facility] - [place], [place] - [address] Phone: [phone]  Fax: [phone]   3. Do they need a 30 day or 90 day supply? 90

## 2024-02-04 ENCOUNTER — Ambulatory Visit
Payer: No Typology Code available for payment source | Attending: Cardiovascular Disease | Admitting: Cardiovascular Disease

## 2024-02-04 ENCOUNTER — Encounter: Payer: Self-pay | Admitting: Cardiovascular Disease

## 2024-02-04 VITALS — BP 140/72 | HR 62 | Resp 16 | Ht 70.0 in | Wt 195.0 lb

## 2024-02-04 DIAGNOSIS — I2510 Atherosclerotic heart disease of native coronary artery without angina pectoris: Secondary | ICD-10-CM | POA: Diagnosis not present

## 2024-02-04 DIAGNOSIS — I739 Peripheral vascular disease, unspecified: Secondary | ICD-10-CM | POA: Diagnosis not present

## 2024-02-04 DIAGNOSIS — E785 Hyperlipidemia, unspecified: Secondary | ICD-10-CM

## 2024-02-04 DIAGNOSIS — Z720 Tobacco use: Secondary | ICD-10-CM | POA: Diagnosis not present

## 2024-02-04 NOTE — Patient Instructions (Addendum)
 Medication Instructions:   Your Physician recommend you continue on your current medication as directed.     *If you need a refill on your cardiac medications before your next appointment, please call your pharmacy*  Lab Work:  No labs ordered today.  Testing/Procedures:  No test ordered today.  Follow-Up: At [facility], you and your health needs are our priority.  As part of our continuing mission to provide you with exceptional heart care, our providers are all part of one team.  This team includes your primary Cardiologist (physician) and Advanced Practice Providers or APPs (Physician Assistants and Nurse Practitioners) who all work together to provide you with the care you need, when you need it.  Your next appointment:   6 month(s)  Provider:   You may see Dr. Kirke Corin or one of the following Advanced Practice Providers on your designated Care Team:   Nicolasa Ducking, NP Ames Dura, PA-C Eula Listen, PA-C Cadence Piedmont, PA-C Charlsie Quest, NP Carlos Levering, NP    We recommend signing up for the patient portal called "MyChart".  Sign up information is provided on this After Visit Summary.  MyChart is used to connect with patients for Virtual Visits (Telemedicine).  Patients are able to view lab/test results, encounter notes, upcoming appointments, etc.  Non-urgent messages can be sent to your provider as well.   To learn more about what you can do with MyChart, go to [email or web address].

## 2024-02-04 NOTE — Progress Notes (Signed)
 Cardiology Office Note   Date:  02/04/2024   ID:  Christon, Parada 1966-03-24, MRN [id number]  PCP:  Garlan Fillers, MD  Cardiologist:  Dr. Okey Dupre  Chief Complaint  Patient presents with   PAD (peripheral artery disease)    Follow-up    3 month      History of Present Illness: Marlowe D Morelos is a 58 y.o. male who is here today for a follow-up visit regarding peripheral arterial disease.   He has known history of coronary artery disease status post one-vessel CABG with LIMA to LAD in August 2023, obstructive sleep apnea, tobacco use, hyperlipidemia and peripheral arterial disease.  He was seen last year for severe right leg claudication.  Angiography was performed in December 2024 which showed severe stenosis in the right external iliac artery with no significant infrainguinal disease.  On the left side, there was moderate external iliac artery stenosis with no significant infrainguinal disease.  I performed successful self-expanding stent placement to the right external iliac artery.  He reports resolution of claudication. He has been doing well with no chest pain or shortness of breath.     Past Medical History:  Diagnosis Date   Anxiety yrs ago   Asthma    Chronic low back pain    Coronary artery disease    Dyslipidemia    GERD (gastroesophageal reflux disease)    Heat rash    both arms and chest   Neck pain    OSA (obstructive sleep apnea)    intolerant of cpap   OSA and COPD overlap syndrome (HCC) 01/09/2016   uses online detal device occ   Peyronie disease     Past Surgical History:  Procedure Laterality Date   ABDOMINAL AORTOGRAM W/LOWER EXTREMITY N/A 10/16/2023   Procedure: ABDOMINAL AORTOGRAM W/LOWER EXTREMITY;  Surgeon: Iran Ouch, MD;  [facility];  Service: Cardiovascular;  Laterality: N/A;   COLONOSCOPY     CORONARY ARTERY BYPASS GRAFT  06/01/2022   LIMA-LAD   ESOPHAGOGASTRODUODENOSCOPY     HIP PINNING,CANNULATED Right  01/30/2017   Procedure: CANNULATED HIP PINNING;  Surgeon: Deeann Saint, MD;  Location: [facility];  Service: Orthopedics;  Laterality: Right;   NESBIT PROCEDURE N/A 04/08/2020   Procedure: NESBIT PROCEDURE, PENILE BLOCK;  Surgeon: Sebastian Ache, MD;  Location: [facility];  Service: Urology;  Laterality: N/A;  1 HR   PERIPHERAL VASCULAR BALLOON ANGIOPLASTY  10/16/2023   Procedure: PERIPHERAL VASCULAR BALLOON ANGIOPLASTY;  Surgeon: Iran Ouch, MD;  [facility]: [facility];  Service: Cardiovascular;;   PERIPHERAL VASCULAR INTERVENTION  10/16/2023   Procedure: PERIPHERAL VASCULAR INTERVENTION;  Surgeon: Iran Ouch, MD;  [facility];  Service: Cardiovascular;;     Current Outpatient Medications  Medication Sig Dispense Refill   acetaminophen (TYLENOL) 500 MG tablet Take 500 mg by mouth every 6 (six) hours as needed for moderate pain (pain score 4-6).     albuterol (VENTOLIN HFA) 108 (90 Base) MCG/ACT inhaler Inhale 1-2 puffs into the lungs every 6 (six) hours as needed for shortness of breath or wheezing.     aspirin EC 81 MG tablet Take 81 mg by mouth daily.     clopidogrel (PLAVIX) 75 MG tablet Take 1 tablet (75 mg total) by mouth daily. 30 tablet 6   Evolocumab (REPATHA SURECLICK) 140 MG/ML SOAJ Inject 140 mg into the skin every 14 (fourteen) days. 6 mL 2   fluticasone (FLONASE ALLERGY  RELIEF) 50 MCG/ACT nasal spray Place 1 spray into both nostrils daily as needed for allergies or rhinitis.     melatonin 5 MG TABS Take 5 mg by mouth at bedtime as needed (sleep).     metoprolol tartrate (LOPRESSOR) 25 MG tablet Take 0.5 tablets (12.5 mg total) by mouth 2 (two) times daily. 90 tablet 2   nitroGLYCERIN (NITROSTAT) 0.4 MG SL tablet Place 1 tablet (0.4 mg total) under the tongue every 5 (five) minutes as needed for chest pain. 25 tablet 3   pantoprazole (PROTONIX) 40 MG tablet Take 40 mg by mouth daily.     Phenylephrine-APAP-guaiFENesin (MUCINEX  SINUS-MAX CONGESTION PO) Take by mouth every 4 (four) hours.     Phenylephrine-Bromphen-DM (DIMETAPP CHILDRENS COLD/COUGH) 2.5-1-5 MG/5ML LIQD Take by mouth as needed.     No current facility-administered medications for this visit.    Allergies:   Pollen extract    Social History:  The patient  reports that he has been smoking cigarettes. He started smoking about 40 years ago. He has a 40.6 pack-year smoking history. He has never used smokeless tobacco. He reports that he does not currently use alcohol. He reports that he does not use drugs.   Family History:  The patient's family history includes COPD in his father; Dementia in his mother; Diabetes Mellitus II in his father; Heart disease (age of onset: 69) in his father; Hyperlipidemia in his father; Prostate cancer in his brother.    ROS:  Please see the history of present illness.   Otherwise, review of systems are positive for none.   All other systems are reviewed and negative.    PHYSICAL EXAM: VS:  BP (!) 140/72 (BP Location: Left Arm, Patient Position: Sitting, Cuff Size: Normal)   Pulse 62   Resp 16   Ht 5\' 10"  (1.778 m)   Wt 195 lb (88.5 kg)   SpO2 96%   BMI 27.98 kg/m  , BMI Body mass index is 27.98 kg/m. GEN: Well nourished, well developed, in no acute distress  HEENT: normal  Neck: no JVD, carotid bruits, or masses Cardiac: RRR; no murmurs, rubs, or gallops,no edema  Respiratory:  clear to auscultation bilaterally, normal work of breathing GI: soft, nontender, nondistended, + BS MS: no deformity or atrophy  Skin: warm and dry, no rash Neuro:  Strength and sensation are intact Psych: euthymic mood, full affect    EKG:  EKG ordered today.  Normal sinus rhythm Possible Left atrial enlargement When compared with ECG of 18-Sep-2023 16:29, No significant change was found   Recent Labs: 10/23/2023: ALT 29; BUN 10; Creatinine, Ser 0.99; Hemoglobin 13.9; Platelets 167; Potassium 4.1; Sodium 132    Lipid  Panel    Component Value Date/Time   CHOL 112 09/03/2023 0911   TRIG 126 09/03/2023 0911   HDL 23 (L) 09/03/2023 0911   CHOLHDL 4.9 09/03/2023 0911   LDLCALC 66 09/03/2023 0911      Wt Readings from Last 3 Encounters:  02/04/24 195 lb (88.5 kg)  10/31/23 197 lb 8 oz (89.6 kg)  10/23/23 195 lb 1.7 oz (88.5 kg)          10/08/2023    4:42 PM 03/06/2023    9:55 AM  PAD Screen  Previous PAD dx? No No  Previous surgical procedure? No No  Pain with walking? Yes Yes  Subsides with rest? No No  Feet/toe relief with dangling? No No  Painful, non-healing ulcers? No No  Extremities discolored? No No  ASSESSMENT AND PLAN:  1.  Peripheral arterial disease: Status post self-expanding stent placement to the right external iliac artery for severe claudication.  Continue dual antiplatelet therapy for now for 6 to 12 months from the time of placement.  Repeat ABI and aortoiliac duplex in August.  2.  Coronary artery disease involving native coronary arteries without angina: No anginal symptoms.  3.  Hyperlipidemia with history of statin myopathy: He is doing very well with Repatha.  I reviewed most recent lipid profile with him which was done in November and showed an LDL of 60.  4.  Tobacco use: I again discussed with him the importance of smoking cessation.  He reports inability to quit at the present time.   Disposition:   Follow-up in 6 months.  Signed,  Lorine Bears, MD  02/04/2024 4:24 PM    [facility]

## 2024-03-11 ENCOUNTER — Encounter: Payer: Self-pay | Admitting: Internal Medicine

## 2024-03-11 ENCOUNTER — Ambulatory Visit: Payer: No Typology Code available for payment source | Attending: Internal Medicine | Admitting: Internal Medicine

## 2024-03-11 VITALS — BP 120/60 | HR 69 | Ht 71.0 in | Wt 194.5 lb

## 2024-03-11 DIAGNOSIS — G720 Drug-induced myopathy: Secondary | ICD-10-CM

## 2024-03-11 DIAGNOSIS — I2510 Atherosclerotic heart disease of native coronary artery without angina pectoris: Secondary | ICD-10-CM

## 2024-03-11 DIAGNOSIS — E785 Hyperlipidemia, unspecified: Secondary | ICD-10-CM

## 2024-03-11 DIAGNOSIS — I739 Peripheral vascular disease, unspecified: Secondary | ICD-10-CM | POA: Diagnosis not present

## 2024-03-11 DIAGNOSIS — T466X5D Adverse effect of antihyperlipidemic and antiarteriosclerotic drugs, subsequent encounter: Secondary | ICD-10-CM

## 2024-03-11 MED ORDER — NITROGLYCERIN 0.4 MG SL SUBL: 0.4000 mg | SUBLINGUAL_TABLET | SUBLINGUAL | 3 refills | Status: AC | PRN

## 2024-03-11 NOTE — Patient Instructions (Signed)
 Medication Instructions:  Your physician recommends that you continue on your current medications as directed. Please refer to the Current Medication list given to you today.   *If you need a refill on your cardiac medications before your next appointment, please call your pharmacy*  Lab Work: No labs ordered today   Testing/Procedures: No test ordered today   Follow-Up: At [facility], you and your health needs are our priority.  As part of our continuing mission to provide you with exceptional heart care, our providers are all part of one team.  This team includes your primary Cardiologist (physician) and Advanced Practice Providers or APPs (Physician Assistants and Nurse Practitioners) who all work together to provide you with the care you need, when you need it.  Your next appointment:   9 month(s)  Provider:   You may see Sammy Crisp, MD or one of the following Advanced Practice Providers on your designated Care Team:   Laneta Pintos, NP Gildardo Labrador, PA-C Varney Gentleman, PA-C Cadence Greene, PA-C Ronald Cockayne, NP Morey Ar, NP    We recommend signing up for the patient portal called "MyChart".  Sign up information is provided on this After Visit Summary.  MyChart is used to connect with patients for Virtual Visits (Telemedicine).  Patients are able to view lab/test results, encounter notes, upcoming appointments, etc.  Non-urgent messages can be sent to your provider as well.   To learn more about what you can do with MyChart, go to [email or web address].

## 2024-03-11 NOTE — Progress Notes (Unsigned)
  Cardiology Office Note:  .   Date:  03/12/2024  ID:  PRAYAN LANGEL, DOB [birth year], MRN [id number] PCP: Bertha Broad, MD  [facility] Cardiologist:  Sammy Crisp, MD     History of Present Illness: .   Garrett Park is a 58 y.o. male with history of coronary artery disease status post single-vessel CABG (off-pump LIMA-LAD) in [place], [place] in 05/2022, and obstructive sleep apnea, who presents for follow-up of CAD and PAD.  He underwent successful stenting of right iliac artery stenosis by Dr. Alvenia Aus in 09/2023 and notes significant improvement in his ability to walk.  He has minimal claudication at this point, more so on the left now.  The procedure was complicated by a left groin hematoma that began draining about a week after the procedure.  This has since resolved.  He denies chest pain, shortness of breath, palpitations, lightheadedness, and edema.  He is scheduled for labs through his PCP tomorrow.  He is tolerating his medications well.  ROS: See HPI  Studies Reviewed: .        Risk Assessment/Calculations:             Physical Exam:   VS:  BP 120/60 (BP Location: Left Arm, Patient Position: Sitting, Cuff Size: Normal)   Pulse 69   Ht 5\' 11"  (1.803 m)   Wt 194 lb 8 oz (88.2 kg)   SpO2 97%   BMI 27.13 kg/m    Wt Readings from Last 3 Encounters:  03/11/24 194 lb 8 oz (88.2 kg)  02/04/24 195 lb (88.5 kg)  10/31/23 197 lb 8 oz (89.6 kg)    General:  NAD. Neck: No JVD or HJR. Lungs: Clear to auscultation bilaterally without wheezes or crackles. Heart: Regular rate and rhythm without murmurs, rubs, or gallops. Abdomen: Soft, nontender, nondistended. Extremities: No lower extremity edema.  ASSESSMENT AND PLAN: .    Coronary artery disease status post CABG: Mr. Virag is doing well status post single-vessel CABG in 05/2022.  We will plan to continue his current medications for secondary prevention, including aspirin  and clopidogrel  (clopidogrel  can be  discontinued at the discretion of Dr. Alvenia Aus following iliac artery stenting in 09/2023) and evolocumab  given history of statin intolerance.  Continue metoprolol  as well.  Nitroglycerin  refill provided today.  PAD: Right lower extremity claudication significantly improved following iliac artery intervention in 09/2023.  Continue DAPT at the discretion of Dr. Alvenia Aus.  Continue secondary prevention with evolocumab  given statin intolerance.  Hyperlipidemia and statin myopathy: Continue evolocumab  with labs to be drawn by PCP tomorrow to assess response.  Continue to target LDL less than 70 (ideally less than 55) in the setting of CAD and PAD.    Dispo: Return to clinic in 9 months.  Signed, Sammy Crisp, MD

## 2024-03-12 ENCOUNTER — Encounter: Payer: Self-pay | Admitting: Internal Medicine

## 2024-03-18 ENCOUNTER — Other Ambulatory Visit: Payer: Self-pay

## 2024-06-03 ENCOUNTER — Telehealth: Payer: Self-pay | Admitting: Internal Medicine

## 2024-06-03 MED ORDER — REPATHA SURECLICK 140 MG/ML ~~LOC~~ SOAJ: 140.0000 mg | SUBCUTANEOUS | 3 refills | Status: AC

## 2024-06-03 NOTE — Telephone Encounter (Signed)
*  STAT* If patient is at the pharmacy, call can be transferred to refill team.   1. Which medications need to be refilled? (please list name of each medication and dose if known) Evolocumab  (REPATHA  SURECLICK) 140 MG/ML SOAJ    2. Would you like to learn more about the convenience, safety, & potential cost savings by using the [facility]? No   3. Are you open to using the [facility] (Type [facility]. ) No   4. Which pharmacy/location (including street and city if local pharmacy) is medication to be sent to? [facility] - [place], [place] - [address]    5. Do they need a 30 day or 90 day supply? 30 day  Pt is out of medication

## 2024-06-17 ENCOUNTER — Encounter (HOSPITAL_COMMUNITY)

## 2024-07-15 ENCOUNTER — Ambulatory Visit (INDEPENDENT_AMBULATORY_CARE_PROVIDER_SITE_OTHER)

## 2024-07-15 ENCOUNTER — Ambulatory Visit: Attending: Cardiovascular Disease

## 2024-07-15 DIAGNOSIS — I739 Peripheral vascular disease, unspecified: Secondary | ICD-10-CM

## 2024-07-15 DIAGNOSIS — Z4889 Encounter for other specified surgical aftercare: Secondary | ICD-10-CM | POA: Diagnosis not present

## 2024-07-16 ENCOUNTER — Ambulatory Visit: Payer: Self-pay | Admitting: Cardiovascular Disease

## 2024-07-16 DIAGNOSIS — I739 Peripheral vascular disease, unspecified: Secondary | ICD-10-CM

## 2024-07-16 LAB — VAS US ABI WITH/WO TBI
Left ABI: 0.96
Right ABI: 0.83

## 2024-09-01 ENCOUNTER — Encounter: Payer: Self-pay | Admitting: Cardiovascular Disease

## 2024-09-01 ENCOUNTER — Ambulatory Visit: Attending: Cardiovascular Disease | Admitting: Cardiovascular Disease

## 2024-09-01 VITALS — BP 128/76 | HR 76 | Ht 70.0 in | Wt 192.4 lb

## 2024-09-01 DIAGNOSIS — I2510 Atherosclerotic heart disease of native coronary artery without angina pectoris: Secondary | ICD-10-CM

## 2024-09-01 DIAGNOSIS — E785 Hyperlipidemia, unspecified: Secondary | ICD-10-CM | POA: Diagnosis not present

## 2024-09-01 DIAGNOSIS — I739 Peripheral vascular disease, unspecified: Secondary | ICD-10-CM | POA: Diagnosis not present

## 2024-09-01 DIAGNOSIS — Z720 Tobacco use: Secondary | ICD-10-CM

## 2024-09-01 NOTE — Patient Instructions (Signed)
 Medication Instructions:  No changes *If you need a refill on your cardiac medications before your next appointment, please call your pharmacy*  Lab Work: None ordered If you have labs (blood work) drawn today and your tests are completely normal, you will receive your results only by: MyChart Message (if you have MyChart) OR A paper copy in the mail If you have any lab test that is abnormal or we need to change your treatment, we will call you to review the results.  Testing/Procedures: None ordered  Follow-Up: At [facility], you and your health needs are our priority.  As part of our continuing mission to provide you with exceptional heart care, our providers are all part of one team.  This team includes your primary Cardiologist (physician) and Advanced Practice Providers or APPs (Physician Assistants and Nurse Practitioners) who all work together to provide you with the care you need, when you need it.  Your next appointment:   12 month(s)  Provider:   Dr. Alvenia Aus  We recommend signing up for the patient portal called "MyChart".  Sign up information is provided on this After Visit Summary.  MyChart is used to connect with patients for Virtual Visits (Telemedicine).  Patients are able to view lab/test results, encounter notes, upcoming appointments, etc.  Non-urgent messages can be sent to your provider as well.   To learn more about what you can do with MyChart, go to [email or web address].

## 2024-09-01 NOTE — Progress Notes (Signed)
 Cardiology Office Note   Date:  09/01/2024   ID:  Garrett, [name] Park 26, 1967, MRN [id number]  PCP:  Garrett Park MATSU, MD  Cardiologist:  Dr. Mady  Chief Complaint  Patient presents with   Follow-up     follow up  / pt has been doing well with no complaints of chest pain, chest pressure or SOB, medciation reviewed verbally with patient      History of Present Illness: Garrett Park is a 58 y.o. male who is here today for a follow-up visit regarding peripheral arterial disease.   He has known history of coronary artery disease status post one-vessel CABG with LIMA to LAD in August 2023, obstructive sleep apnea, tobacco use, hyperlipidemia and peripheral arterial disease.  He was seen in 2024 for severe right leg claudication.  Angiography was performed in December 2024 which showed severe stenosis in the right external iliac artery with no significant infrainguinal disease.  On the left side, there was moderate external iliac artery stenosis with no significant infrainguinal disease.  I performed successful self-expanding stent placement to the right external iliac artery.    Most recent Doppler studies in September showed a drop in his ABI on the right side to 0.83.  Duplex showed patent right external iliac artery stent with moderately elevated velocity in the midsegment at 283.  Left external iliac artery velocity was 351. He has been doing well with no chest pain or shortness of breath.  He denies lower extremity claudication.   Past Medical History:  Diagnosis Date   Anxiety yrs ago   Asthma    Chronic low back pain    Coronary artery disease    Dyslipidemia    GERD (gastroesophageal reflux disease)    Heat rash    both arms and chest   Neck pain    OSA (obstructive sleep apnea)    intolerant of cpap   OSA and COPD overlap syndrome (HCC) 01/09/2016   uses online detal device occ   Peyronie disease     Past Surgical History:  Procedure Laterality Date    ABDOMINAL AORTOGRAM W/LOWER EXTREMITY N/A 10/16/2023   Procedure: ABDOMINAL AORTOGRAM W/LOWER EXTREMITY;  Surgeon: Darron Deatrice LABOR, MD;  [facility];  Service: Cardiovascular;  Laterality: N/A;   COLONOSCOPY     CORONARY ARTERY BYPASS GRAFT  06/01/2022   LIMA-LAD   ESOPHAGOGASTRODUODENOSCOPY     HIP PINNING,CANNULATED Right 01/30/2017   Procedure: CANNULATED HIP PINNING;  Surgeon: Kayla Pinal, MD;  Location: [facility];  Service: Orthopedics;  Laterality: Right;   NESBIT PROCEDURE N/A 04/08/2020   Procedure: NESBIT PROCEDURE, PENILE BLOCK;  Surgeon: Alvaro Hummer, MD;  Location: [facility];  Service: Urology;  Laterality: N/A;  1 HR   PERIPHERAL VASCULAR BALLOON ANGIOPLASTY  10/16/2023   Procedure: PERIPHERAL VASCULAR BALLOON ANGIOPLASTY;  Surgeon: Darron Deatrice LABOR, MD;  [facility];  Service: Cardiovascular;;   PERIPHERAL VASCULAR INTERVENTION  10/16/2023   Procedure: PERIPHERAL VASCULAR INTERVENTION;  Surgeon: Darron Deatrice LABOR, MD;  [facility];  Service: Cardiovascular;;     Current Outpatient Medications  Medication Sig Dispense Refill   acetaminophen  (TYLENOL ) 500 MG tablet Take 500 mg by mouth every 6 (six) hours as needed for moderate pain (pain score 4-6).     albuterol (VENTOLIN HFA) 108 (90 Base) MCG/ACT inhaler Inhale 1-2 puffs into the lungs every 6 (six) hours as needed for shortness of breath or wheezing.  aspirin  EC 81 MG tablet Take 81 mg by mouth daily.     azelastine (ASTELIN) 0.1 % nasal spray Place into both nostrils 2 (two) times daily. Use in each nostril as directed     Evolocumab  (REPATHA  SURECLICK) 140 MG/ML SOAJ Inject 140 mg into the skin every 14 (fourteen) days. 6 mL 3   fluticasone (FLONASE ALLERGY RELIEF) 50 MCG/ACT nasal spray Place 1 spray into both nostrils daily as needed for allergies or rhinitis.     melatonin 5 MG TABS Take 5 mg by mouth at bedtime as needed (sleep).      metoprolol  tartrate (LOPRESSOR ) 25 MG tablet Take 0.5 tablets (12.5 mg total) by mouth 2 (two) times daily. 90 tablet 2   nitroGLYCERIN  (NITROSTAT ) 0.4 MG SL tablet Place 1 tablet (0.4 mg total) under the tongue every 5 (five) minutes as needed for chest pain. 25 tablet 3   pantoprazole  (PROTONIX ) 40 MG tablet Take 40 mg by mouth daily.     Phenylephrine-APAP-guaiFENesin (MUCINEX SINUS-MAX CONGESTION PO) Take by mouth every 4 (four) hours.     Phenylephrine-Bromphen-DM (DIMETAPP CHILDRENS COLD/COUGH) 2.5-1-5 MG/5ML LIQD Take by mouth as needed.     clopidogrel  (PLAVIX ) 75 MG tablet Take 1 tablet (75 mg total) by mouth daily. 30 tablet 6   No current facility-administered medications for this visit.    Allergies:   Pollen extract    Social History:  The patient  reports that he has been smoking cigarettes. He started smoking about 41 years ago. He has a 41.2 pack-year smoking history. He has never used smokeless tobacco. He reports that he does not currently use alcohol . He reports that he does not use drugs.   Family History:  The patient's family history includes COPD in his father; Dementia in his mother; Diabetes Mellitus II in his father; Heart disease (age of onset: 17) in his father; Hyperlipidemia in his father; Prostate cancer in his brother.    ROS:  Please see the history of present illness.   Otherwise, review of systems are positive for none.   All other systems are reviewed and negative.    PHYSICAL EXAM: VS:  BP 128/76 (BP Location: Left Arm, Patient Position: Sitting, Cuff Size: Normal)   Pulse 76   Ht 5' 10 (1.778 m)   Wt 192 lb 6.4 oz (87.3 kg)   SpO2 97%   BMI 27.61 kg/m  , BMI Body mass index is 27.61 kg/m. GEN: Well nourished, well developed, in no acute distress  HEENT: normal  Neck: no JVD, carotid bruits, or masses Cardiac: RRR; no murmurs, rubs, or gallops,no edema  Respiratory:  clear to auscultation bilaterally, normal work of breathing GI: soft,  nontender, nondistended, + BS MS: no deformity or atrophy  Skin: warm and dry, no rash Neuro:  Strength and sensation are intact Psych: euthymic mood, full affect    EKG:  EKG not ordered today.    Recent Labs: 10/23/2023: ALT 29; BUN 10; Creatinine, Ser 0.99; Hemoglobin 13.9; Platelets 167; Potassium 4.1; Sodium 132    Lipid Panel    Component Value Date/Time   CHOL 112 09/03/2023 0911   TRIG 126 09/03/2023 0911   HDL 23 (L) 09/03/2023 0911   CHOLHDL 4.9 09/03/2023 0911   LDLCALC 66 09/03/2023 0911      Wt Readings from Last 3 Encounters:  09/01/24 192 lb 6.4 oz (87.3 kg)  03/11/24 194 lb 8 oz (88.2 kg)  02/04/24 195 lb (88.5 kg)  10/08/2023    4:42 PM 03/06/2023    9:55 AM  PAD Screen  Previous PAD dx? No No  Previous surgical procedure? No No  Pain with walking? Yes Yes  Subsides with rest? No No  Feet/toe relief with dangling? No No  Painful, non-healing ulcers? No No  Extremities discolored? No No      ASSESSMENT AND PLAN:  1.  Peripheral arterial disease: Status post self-expanding stent placement to the right external iliac artery for severe claudication.  He finished treatment with clopidogrel .  Continue aspirin  indefinitely.  He does have moderate restenosis in the right external iliac artery and also borderline stenosis in the left external iliac artery.  However, currently with no claudication.  No indication for revascularization.  I discussed the importance of controlling his risk factors and 30 minutes of daily exercise.  2.  Coronary artery disease involving native coronary arteries without angina: No anginal symptoms.  3.  Hyperlipidemia with history of statin myopathy: He is doing very well with Repatha .  I reviewed most recent lipid profile with him which was done in November of last year and showed an LDL of 60.  4.  Tobacco use: I again discussed with him the importance of smoking cessation.  He was prescribed Repatha  in the past  but could not quit.   Disposition:   Follow-up in 6 months.  Signed,  Deatrice Cage, MD  09/01/2024 1:57 PM    [facility]

## 2024-10-30 ENCOUNTER — Other Ambulatory Visit: Payer: Self-pay | Admitting: Internal Medicine
